# Patient Record
Sex: Male | Born: 1985 | Race: White | Hispanic: No | Marital: Single | Smoking: Former smoker
Health system: Southern US, Community
[De-identification: ages and names within clinical notes are randomized; demographics above are authoritative.]

## PROBLEM LIST (undated history)

## (undated) DIAGNOSIS — I471 Supraventricular tachycardia, unspecified: Secondary | ICD-10-CM

## (undated) HISTORY — DX: Supraventricular tachycardia, unspecified: I47.10

## (undated) HISTORY — DX: Supraventricular tachycardia: I47.1

---
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
## 2006-09-05 ENCOUNTER — Ambulatory Visit: Payer: Self-pay | Admitting: Unknown Physician Specialty

## 2006-09-28 ENCOUNTER — Ambulatory Visit: Payer: Self-pay | Admitting: Unknown Physician Specialty

## 2009-10-24 ENCOUNTER — Emergency Department: Payer: Self-pay | Admitting: Emergency Medicine

## 2010-02-11 ENCOUNTER — Emergency Department: Payer: Self-pay | Admitting: Emergency Medicine

## 2010-02-11 ENCOUNTER — Encounter: Payer: Self-pay | Admitting: Cardiovascular Disease

## 2010-02-15 ENCOUNTER — Ambulatory Visit: Payer: Self-pay | Admitting: Cardiovascular Disease

## 2010-02-15 DIAGNOSIS — R002 Palpitations: Secondary | ICD-10-CM

## 2010-02-22 ENCOUNTER — Encounter: Payer: Self-pay | Admitting: Cardiovascular Disease

## 2010-02-24 ENCOUNTER — Encounter: Payer: Self-pay | Admitting: Cardiovascular Disease

## 2010-02-24 ENCOUNTER — Ambulatory Visit: Payer: Self-pay | Admitting: Cardiology

## 2010-02-24 ENCOUNTER — Ambulatory Visit: Payer: Self-pay | Admitting: Cardiovascular Disease

## 2010-04-10 ENCOUNTER — Ambulatory Visit: Payer: Self-pay | Admitting: Cardiovascular Disease

## 2010-04-10 DIAGNOSIS — R55 Syncope and collapse: Secondary | ICD-10-CM

## 2010-04-14 ENCOUNTER — Encounter: Payer: Self-pay | Admitting: Cardiovascular Disease

## 2010-04-28 ENCOUNTER — Encounter: Payer: Self-pay | Admitting: Cardiovascular Disease

## 2010-05-02 ENCOUNTER — Telehealth: Payer: Self-pay | Admitting: Cardiovascular Disease

## 2010-05-22 ENCOUNTER — Encounter: Payer: Self-pay | Admitting: Cardiovascular Disease

## 2010-06-12 ENCOUNTER — Ambulatory Visit: Payer: Self-pay | Admitting: Cardiovascular Disease

## 2010-06-12 DIAGNOSIS — R0989 Other specified symptoms and signs involving the circulatory and respiratory systems: Secondary | ICD-10-CM | POA: Insufficient documentation

## 2010-11-28 NOTE — Assessment & Plan Note (Signed)
Summary: NP6/AMD   Visit Type:  [facility] Referring Provider:  [facility] Primary Provider:  n/a  CC:  palpitations, sweating, chills, feel like going to pass out, and feel like heart goes crazy - to beating out of chest. Shortness of breath with symptoms. no edema. .  History of Present Illness: 25 year-old male presents for initial evaluation of palpitations. He was evaluated in the Emergency Room at [facility] after the episode, but symptoms have resolved by the time he was evaluated. 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 Felt fatigued for a short time, but has been back to normal state of health since then. He admits to similar episodes about 10 times in the past, but has never sought medical evaluation.   Denies exertional chest pain or dyspnea, but has had episodes of heart racing with activity such as playing basketball when he had to stop playing and sit down because of dizziness, near-syncope.  Preventive Screening-Counseling & Management  Alcohol-Tobacco     Alcohol drinks/day: <1     Smoking Status: current     Packs/Day: 0.5  Caffeine-Diet-Exercise     Caffeine use/day: 80 oz     Does Patient Exercise: no      Drug Use:  yes.    Current Medications (verified): 1)  None  Allergies (verified): No Known Drug Allergies  Past History:  Past medical, surgical, family and social histories (including risk factors) reviewed, and no changes noted (except as noted below).  Past Medical History: none  Past Surgical History: none  Family History: Reviewed history and no changes required. Maternal grandfather: AAA Mom: living 63; severe emphysema, and COPD Dad: living 22; healthy  Social History: Reviewed history and no changes required. Full Time Single  Tobacco Use - Yes.  Alcohol Use - yes Regular Exercise -  no Drug Use - yes Drinks 1 soda and 4-5 sweet teas every day Marijuana - daily Alcohol drinks/day:  <1 Smoking Status:  current Packs/Day:  0.5 Caffeine use/day:  80 oz Does Patient Exercise:  no Drug Use:  yes  Review of Systems       Negative except as per HPI   Vital Signs:  Patient profile:   25 year old male Height:      70 inches Weight:      129.50 pounds BMI:     18.65 Pulse rate:   80 / minute Pulse rhythm:   regular Resp:     12 per minute BP sitting:   110 / 78  (left arm) Cuff size:   regular  Vitals Entered By: Mercer Pod (February 15, 2010 4:13 PM)  Physical Exam  General:  Pt is alert and oriented, thin male, in no acute distress. HEENT: normal Neck: normal carotid upstrokes without bruits, JVP normal Lungs: CTA CV: RRR without murmur or gallop Abd: soft, NT, positive BS, no bruit, no organomegaly Ext: no clubbing, cyanosis, or edema. peripheral pulses 2+ and equal Skin: warm and dry without rash    EKG  Procedure date:  02/11/2010  Findings:      Normal sinus rhythm with sinus arrhythmia, within normal limits. Heart rate 89 beats per minute.  Impression & Recommendations:  Problem # 1:  PALPITATIONS, OCCASIONAL (ICD-785.1) The patient has sudden onset of palpitations and heart racing with multiple associated symptoms as listed. His symptoms are concerning for supraventricular  tachycardia. Because he has also had symptoms related to exertion, recommend 2-D echocardiogram to rule out structural heart disease and treadmill exercise testing to evaluate his heart rate and hemodynamic response to exertion.  Lung discussion regarding lifestyle modification as well. We discussed the importance of complete tobacco and marijuana cessation. He also needs to reduce caffeine intake. He has a family history of both emphysema and abdominal aortic aneurysm and we discussed the relationship of tobacco use to these diseases.  Orders: Echocardiogram  (Echo) Treadmill (Treadmill)  Patient Instructions: 1)  Your physician recommends that you schedule a follow-up appointment in: June in [place] with Dr Excell Seltzer. 2)  Your physician has requested that you have an echocardiogram.  Echocardiography is a painless test that uses sound waves to create images of your heart. It provides your doctor with information about the size and shape of your heart and how well your heart's chambers and valves are working.  This procedure takes approximately one hour. There are no restrictions for this procedure. 3)  Your physician has requested that you have an exercise tolerance test.  For further information please visit [email or web address]

## 2010-11-28 NOTE — Miscellaneous (Signed)
  Clinical Lists Changes  Medications: Added new medication of METOPROLOL TARTRATE 25 MG TABS (METOPROLOL TARTRATE) Take one tablet by mouth twice a day as needed for fast heartrate - Signed Rx of METOPROLOL TARTRATE 25 MG TABS (METOPROLOL TARTRATE) Take one tablet by mouth twice a day as needed for fast heartrate;  #60 x 1;  Signed;  Entered by: Benedict Needy, RN;  Authorized by: Dossie Arbour MD;  Method used: Electronically to [facility] [address] [phone].*, [address] [place], [place], [place]  [address], Ph: [phone], Fax: [phone]    Prescriptions: METOPROLOL TARTRATE 25 MG TABS (METOPROLOL TARTRATE) Take one tablet by mouth twice a day as needed for fast heartrate  #60 x 1   Entered by:   Benedict Needy, RN   Authorized by:   Dossie Arbour MD   Signed by:   Benedict Needy, RN on 04/28/2010   Method used:   Electronically to        [facility] [address] [phone].* (retail)       [address]       [place], [place]  [address]       Ph: [phone]       Fax: [phone]   RxID:   [id number]

## 2010-11-28 NOTE — Progress Notes (Signed)
Summary: Medication  Phone Note Call from Patient Call back at Home Phone [phone]   Caller: Patient Call For: RN Summary of Call: Patient called and stated he was started on a new medication and he has a cold.  Wants to know what medications he can talk for his cold. Initial call taken by: West Carbo,  May 02, 2010 4:08 PM  Follow-up for Phone Call        pt called told him to stay away from robitussin DM Follow-up by: Benedict Needy, RN,  May 02, 2010 4:25 PM

## 2010-11-28 NOTE — Procedures (Signed)
Summary: Holter and Event  Holter and Event   Imported By: West Carbo 05/22/2010 08:06:58  _____________________________________________________________________  External Attachment:    Type:   Image     Comment:   External Document  Appended Document: Holter and Event attempted to call pt with results of event monitor LMOM TCB.  Appended Document: Holter and Event LMOM TCB  Appended Document: Holter and Event pt aware of results.

## 2010-11-28 NOTE — Assessment & Plan Note (Signed)
Summary: F6W/AMD   Visit Type:  Follow-up Referring Provider:  [facility] Primary Provider:  n/a  CC:  rapid heart beats and feeling fatigue.  History of Present Illness: 25 year-old male presenting for follow-up of palpitations. The patient was seen here and conservative therapy was recommended. He underwent an exercise treadmill study demonstrating excellent exercise tolerance and he had no ischemic symptoms, EKG changes, or arrhythmia.   However, he continues to be highly symptomatic with recurrent palpitations associated with fatigue, weakness, and near-syncope. He has had 2 episodes of this in the past week, one lasting about 30 minutes and another about 5 minutes. He denies chest pain, dyspnea, or other complaints.  Current Medications (verified): 1)  None  Allergies (verified): No Known Drug Allergies  Past History:  Past medical history reviewed for relevance to current acute and chronic problems.  Past Medical History: Reviewed history from 02/15/2010 and no changes required. none  Review of Systems       Negative except as per HPI   Vital Signs:  Patient profile:   25 year old male Height:      70 inches Weight:      129 pounds BMI:     18.58 Pulse rate:   76 / minute Resp:     16 per minute BP sitting:   118 / 76  (left arm) Cuff size:   regular  Vitals Entered By: Bishop Dublin, CMA (April 10, 2010 2:31 PM)  Physical Exam  General:  Pt is alert and oriented, thin young male, in no acute distress. HEENT: normal Neck: normal carotid upstrokes without bruits, JVP normal Lungs: CTA CV: RRR without murmur or gallop Abd: soft, NT, positive BS, no bruit, no organomegaly Ext: no clubbing, cyanosis, or edema. peripheral pulses 2+ and equal Skin: warm and dry without rash    EKG  Procedure date:  04/10/2010  Findings:      NSR, minimal voltage criteria for LVH, otherwise WNL  Impression & Recommendations:  Problem # 1:  SYNCOPE (ICD-780.2) This pt  has recurrent palpitations with associated near-syncope, of abrupt onset. I have recommended an event record to further evaluate and attempt to capture an episode so that treatment can be directed at a specific arrhythmia. Will schedule him to f/u with Dr Mariah Milling in 2 months. No meds at this point.  Other Orders: EKG w/ Interpretation (93000) Event (Event)  Patient Instructions: 1)  Your physician recommends that you schedule a follow-up appointment in: 2 months with Gollan 2)  Your physician recommends that you continue on your current medications as directed. Please refer to the Current Medication list given to you today.

## 2010-11-28 NOTE — Assessment & Plan Note (Signed)
Summary: F2M/AMD   Visit Type:  Follow-up Referring Provider:  [facility] Primary Provider:  n/a  CC:  chest pain/at work and 06-11-10.  History of Present Illness: 25 year-old Luis [name] presenting for follow-up of tachycardia associated with near syncope, diaphoresis. He presents for routine followup.  We had him complete an event monitor for 30 days and he reports that during that time, he did not have an episode of his tachycardia arrhythmia. He reports having an episode yesterday lasting for more than 30 minutes. 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 He felt dizzy, diaphoretic. His heart rate was "racing".  Two episodes of sinus tachycardia were recorded on his event monitor but he denies that he was symptomatic during these episodes. During one episode, he reports he may have been playing basketball.  he reports having 6 episodes this year, probably 20 episodes over the past several years.  EKG shows normal sinus rhythm with a rate of Luis beats per minute, no significant ST or T wave changes.  Current Medications (verified): 1)  Metoprolol Tartrate 25 Mg Tabs (Metoprolol Tartrate) .... Take One Tablet By Mouth Twice A Day As Needed For Fast Heartrate  Allergies (verified): No Known Drug Allergies   Review of Systems  The patient denies fever, weight loss, weight gain, vision loss, decreased hearing, hoarseness, chest pain, syncope, dyspnea on exertion, peripheral edema, prolonged cough, abdominal pain, incontinence, muscle weakness, depression, and enlarged lymph nodes.         Tachypalpitations yesterday  Vital Signs:  Patient profile:   25 year old Luis [name] Height:      70 inches Weight:      130.75 pounds BMI:     18.83 Pulse rate:   98 / minute BP sitting:   114 / 62  (left arm) Cuff size:   regular  Vitals Entered By: Caralee Ates CMA (June 12, 2010 2:18 PM)  Physical Exam  General:  Pt is alert and oriented, thin young Luis [name], in no  acute distress. HEENT: normal Neck: normal carotid upstrokes without bruits, JVP normal Lungs: CTA CV: RRR without murmur or gallop Abd: soft, NT, positive BS, no bruit, no organomegaly Ext: no clubbing, cyanosis, or edema. peripheral pulses 2+ and equal Skin: warm and dry without rash    New Orders:     1)  EKG w/ Interpretation (93000)   Impression & Recommendations:  Problem # 1:  TACHYCARDIA (ICD-785) I suspect that he is having SVT or some other tachyarrhythmia  that we have been unable to document this on an event monitor. The case was discussed with Dr. Graciela Husbands of the electrophysiology Department. We will recommend that he try to document his rhythm was either with rapid presentation to a fire Department for telemetry monitoring or to our office for EKG. I have asked him to try verapamil 40 mg p.r.n. with his metoprolol 25 mg p.r.n. if he has episodes. We have also talked him able maneuvers as well as carotid sinus massage.  As soon as we are able to identify his underlying paroxysmal and frequent tachyarrhythmia, we will be able to proceed either with ablation or medical management.  Other Orders: EKG w/ Interpretation (93000)  Patient Instructions: 1)  Your physician has recommended you make the following change in your medication: START verapamil once daily as needed for fast heart rate.  2)  Go to fire department when you have and episode for EKG. 3)  Your physician wants  you to follow-up in:  3 months  You will receive a reminder letter in the mail two months in advance. If you don't receive a letter, please call our office to schedule the follow-up appointment. Prescriptions: VERAPAMIL HCL 40 MG TABS (VERAPAMIL HCL) Take 1 tablet by mouth once a day as needed for fast heart rate  #30 x 1   Entered by:   Benedict Needy, RN   Authorized by:   Dossie Arbour MD   Signed by:   Benedict Needy, RN on 06/12/2010   Method used:   Electronically to        [facility] [address]  [phone].* (retail)       [address]       [place], [place]  [address]       Ph: [phone]       Fax: [phone]   RxID:   [id number]

## 2010-11-28 NOTE — Progress Notes (Signed)
Summary: PHI  PHI   Imported By: Park Breed 02/22/2010 15:50:55  _____________________________________________________________________  External Attachment:    Type:   Image     Comment:   External Document

## 2010-11-30 NOTE — Procedures (Signed)
Summary: [facility]  [facility]   Imported By: Harlon Flor 11/02/2010 10:11:07  _____________________________________________________________________  External Attachment:    Type:   Image     Comment:   External Document

## 2011-01-06 ENCOUNTER — Encounter: Payer: Self-pay | Admitting: Cardiovascular Disease

## 2011-01-06 ENCOUNTER — Emergency Department: Payer: Self-pay | Admitting: Emergency Medicine

## 2011-01-18 ENCOUNTER — Telehealth: Payer: Self-pay | Admitting: *Deleted

## 2011-01-18 NOTE — Telephone Encounter (Signed)
Called and LMOM TCB. Pt had dropped off EKG showing SVT. Pt was told by Dr. Mariah Milling if he had another episode to bring by our office. When able to contact pt, need to schedule him with EP.

## 2011-01-19 ENCOUNTER — Ambulatory Visit (INDEPENDENT_AMBULATORY_CARE_PROVIDER_SITE_OTHER): Payer: Self-pay | Admitting: Cardiovascular Disease

## 2011-01-19 ENCOUNTER — Encounter (INDEPENDENT_AMBULATORY_CARE_PROVIDER_SITE_OTHER): Payer: Self-pay | Admitting: Cardiovascular Disease

## 2011-01-19 ENCOUNTER — Encounter: Payer: Self-pay | Admitting: Cardiovascular Disease

## 2011-01-19 VITALS — BP 100/58 | HR 56 | Resp 16 | Ht 70.0 in | Wt 126.0 lb

## 2011-01-19 VITALS — BP 100/58 | HR 56 | Ht 70.0 in | Wt 126.0 lb

## 2011-01-19 DIAGNOSIS — R0989 Other specified symptoms and signs involving the circulatory and respiratory systems: Secondary | ICD-10-CM

## 2011-01-19 DIAGNOSIS — I471 Supraventricular tachycardia, unspecified: Secondary | ICD-10-CM

## 2011-01-19 DIAGNOSIS — R55 Syncope and collapse: Secondary | ICD-10-CM

## 2011-01-19 DIAGNOSIS — I498 Other specified cardiac arrhythmias: Secondary | ICD-10-CM

## 2011-01-19 NOTE — Progress Notes (Signed)
   Patient ID: Luis [name], male    DOB: November 24, 1985, 24 y.o.   MRN: [id number]  HPI Comments: 25 year-old male presenting for follow-up of tachycardia associated with near syncope, diaphoresis.   He has had a three-year history of episodes of tachycardia palpitation. His last episode was approximately 10 days ago. EMTs were called, did an EKG him a noticed a narrow complex tachycardia and gave him adenosine which converted his rhythm to normal sinus.  He has had more than 20 episodes over the past several years, the last episode prior to his most recent event was 6 months ago. He has been unable to tolerate rhythm controlling medication as he has baseline bradycardia. He is very interested in a ablation.  In the past, he did complete an event monitor for 30 days and he reports that during that time, he did not have an episode of his tachycardia arrhythmia.   he reports having 6 episodes in 2011.  EKG shows normal sinus rhythm with a rate of 56 beats per minute, no significant ST or T wave changes.Nonspecific ST changes noted.     Review of Systems  Constitutional: Negative.   HENT: Negative.   Eyes: Negative.   Respiratory: Negative.   Cardiovascular: Negative for chest pain and leg swelling.       Tachycardia/palpitations  Gastrointestinal: Negative.   Musculoskeletal: Negative.   Skin: Negative.   Neurological: Negative.   Hematological: Negative.   Psychiatric/Behavioral: Negative.   All other systems reviewed and are negative.    BP 100/58  Pulse 56  Wt 126 lb (57.153 kg)   Physical Exam  Nursing note and vitals reviewed. Constitutional: He is oriented to person, place, and time. He appears well-developed and well-nourished.  HENT:  Head: Normocephalic.  Nose: Nose normal.  Mouth/Throat: Oropharynx is clear and moist.  Eyes: Conjunctivae are normal. Pupils are equal, round, and reactive to light.  Neck: Normal range of motion. Neck supple. No JVD present.    Cardiovascular: Normal rate, regular rhythm, normal heart sounds and intact distal pulses.  Exam reveals no gallop and no friction rub.   No murmur heard. Pulmonary/Chest: Effort normal and breath sounds normal. No respiratory distress. He has no wheezes. He has no rales. He exhibits no tenderness.  Abdominal: Soft. Bowel sounds are normal. He exhibits no distension. There is no tenderness.  Musculoskeletal: Normal range of motion. He exhibits no edema and no tenderness.  Lymphadenopathy:    He has no cervical adenopathy.  Neurological: He is alert and oriented to person, place, and time. Coordination normal.  Skin: Skin is warm and dry. No rash noted. No erythema.  Psychiatric: He has a normal mood and affect. His behavior is normal. Judgment and thought content normal.           Assessment and Plan

## 2011-01-19 NOTE — Patient Instructions (Signed)
Dr. Sharrell Ku will contact you in the next few days to arrange an appt. In [place] to meet with him. At this Appt., he will discuss further treatment options for your tachycardia.

## 2011-01-19 NOTE — Assessment & Plan Note (Signed)
Recent EKG done by EMTs confirms SVT. Last episode 10 days ago that converted with adenosine IV. He is interested in an ablation. We will set him up for followup in [place] with EP to discuss the various treatment options. He reports more than 20 episodes over the past several years.

## 2011-01-22 ENCOUNTER — Encounter: Payer: Self-pay | Admitting: Cardiovascular Disease

## 2011-01-22 NOTE — Assessment & Plan Note (Signed)
Previous epsiodes of syncope and near syncope likely secondary to previous epsiodes of SVT.  Will refer for ablation.

## 2011-01-24 ENCOUNTER — Ambulatory Visit: Payer: Self-pay | Admitting: Internal Medicine

## 2011-01-25 ENCOUNTER — Encounter: Payer: Self-pay | Admitting: Internal Medicine

## 2011-01-26 ENCOUNTER — Ambulatory Visit (INDEPENDENT_AMBULATORY_CARE_PROVIDER_SITE_OTHER): Payer: Self-pay | Admitting: Internal Medicine

## 2011-01-26 ENCOUNTER — Encounter: Payer: Self-pay | Admitting: Internal Medicine

## 2011-01-26 VITALS — BP 120/70 | HR 65 | Ht 70.0 in | Wt 126.0 lb

## 2011-01-26 DIAGNOSIS — I498 Other specified cardiac arrhythmias: Secondary | ICD-10-CM

## 2011-01-26 DIAGNOSIS — I471 Supraventricular tachycardia, unspecified: Secondary | ICD-10-CM

## 2011-01-26 NOTE — Progress Notes (Signed)
Mr Luis [name] is a pleasant 25 yo WM with a history of recently diagnosed SVT who presents today for EP consultation.  He reports initially developing symptoms of "heart racing" two years ago.  He states that two years ago, he sneezed and then developed abrupt tachypalpitations lasting several seconds.  He subsequently noted intermittent episodes of abrupt onset and termination of "heart racing" frequently lasting several minutes.  Unfortunately, episodes were unpredictable and could not be captured.  He states that he has made trips to the ER but had converted to sinus rhythm upon being evaluated.  He also was evaluated in our office and had a holter monitor which also failed to document tachycardia.  He is unaware of clear triggers for tachycardia but has found that bending over or lifted would cause tachycardia.  He has not found vagal maneuvers particularly helpful. On 01/06/11, he developed sustained tachycardia while carrying a box of produce at work.  He was evaluated by EMS and found to have SVT at 210 bpm.  His tachycardia terminated with adenosine.  He has not had further tachycardia since that time.  Today, he denies symptoms of palpitations, chest pain, shortness of breath, orthopnea, PND, lower extremity edema, dizziness, presyncope, syncope, or neurologic sequela. The patient is otherwise without complaint today.  He has metoprolol to take as needed during SVT.   Past Medical History  Diagnosis Date  . SVT (supraventricular tachycardia)    Past Surgical History  Procedure Date  . Root canal 2005    Current outpatient prescriptions:metoprolol tartrate (LOPRESSOR) 25 MG tablet, Take 25 mg by mouth 2 (two) times daily as needed.  , Disp: , Rfl:   No Known Allergies  History   Social History  . Marital Status: Single    Spouse Name: N/A    Number of Children: N/A  . Years of Education: N/A   Occupational History  . Not on file.   Social History Main Topics  . Smoking status:  Current Everyday Smoker -- 1.0 packs/day for 6 years    Types: Cigarettes  . Smokeless tobacco: Not on file  . Alcohol Use: 0.6 oz/week    1 Cans of beer per week     drinks on weekends  . Drug Use: 2 per week    Special: Marijuana  . Sexually Active: Not on file   Other Topics Concern  . Not on file   Social History Narrative   Lives in [place] with girlfriends father.  Works as Actor.    Family History  Problem Relation Age of Onset  . Hypertension Father   . Heart disease Maternal Grandfather     ROS- All systems are reviewed and negative except as per the HPI above  Physical Exam: Filed Vitals:   01/26/11 1229  BP: 120/70  Pulse: 65  Height: 5\' 10"  (1.778 m)  Weight: 126 lb (57.153 kg)    GEN- The patient is well appearing, alert and oriented x 3 today.   Head- normocephalic, atraumatic Eyes-  Sclera clear, conjunctiva pink Ears- hearing intact Oropharynx- clear Neck- supple, no JVP Lymph- no cervical lymphadenopathy Lungs- Clear to ausculation bilaterally, normal work of breathing Heart- Regular rate and rhythm, no murmurs, rubs or gallops, PMI not laterally displaced GI- soft, NT, ND, + BS Extremities- no clubbing, cyanosis, or edema MS- no significant deformity or atrophy Skin- no rash or lesion Psych- euthymic mood, full affect Neuro- strength and sensation are intact

## 2011-01-26 NOTE — Assessment & Plan Note (Signed)
Luis [name] presents today for further evaluation of SVT.  He has well documented narrow complex SVT at 210 bpm from 01/06/11. This tachycardia was terminated with adenosine.  I have reviewed an ekg from today which reveals sinus rhythm at 65 bpm, with PR 148, QRS 102, and Qtc 409.  His ekg today is normal and there is no evidence of pre-excitation. Therapeutic strategies for supraventricular tachycardia including medicine and ablation were discussed in detail with the patient today. Risk, benefits, and alternatives to EP study and radiofrequency ablation were also discussed in detail today. At the present time, he wishes to further contemplate his options.  He will contact my office if he wishes to proceed with ablation. I have scheduled follow-up in 4 months.

## 2011-01-26 NOTE — Patient Instructions (Signed)
Your physician recommends that you schedule a follow-up appointment in: 4 months with Dr Allred   Your physician recommends that you continue on your current medications as directed. Please refer to the Current Medication list given to you today.   

## 2011-01-29 NOTE — Progress Notes (Signed)
Addended by: Laurance Flatten on: 01/29/2011 11:04 AM   Modules accepted: Orders

## 2011-04-15 IMAGING — CR DG CHEST 2V
1 series · 2 of 2 positions shown · non-contrast
Comparison: none

REASON FOR EXAM: palpatations
COMMENTS:

PROCEDURE:     DXR - DXR CHEST PA (OR AP) AND LATERAL  - February 11, 2010  [DATE]
RESULT:     The lungs are clear. The heart and pulmonary vessels are normal.
The bony and mediastinal structures are unremarkable. There is no effusion.
There is no pneumothorax or evidence of congestive failure.

[Series 1: view not recorded · 0.17mm/px · 2 of 2 slices shown]
[im 1/2]
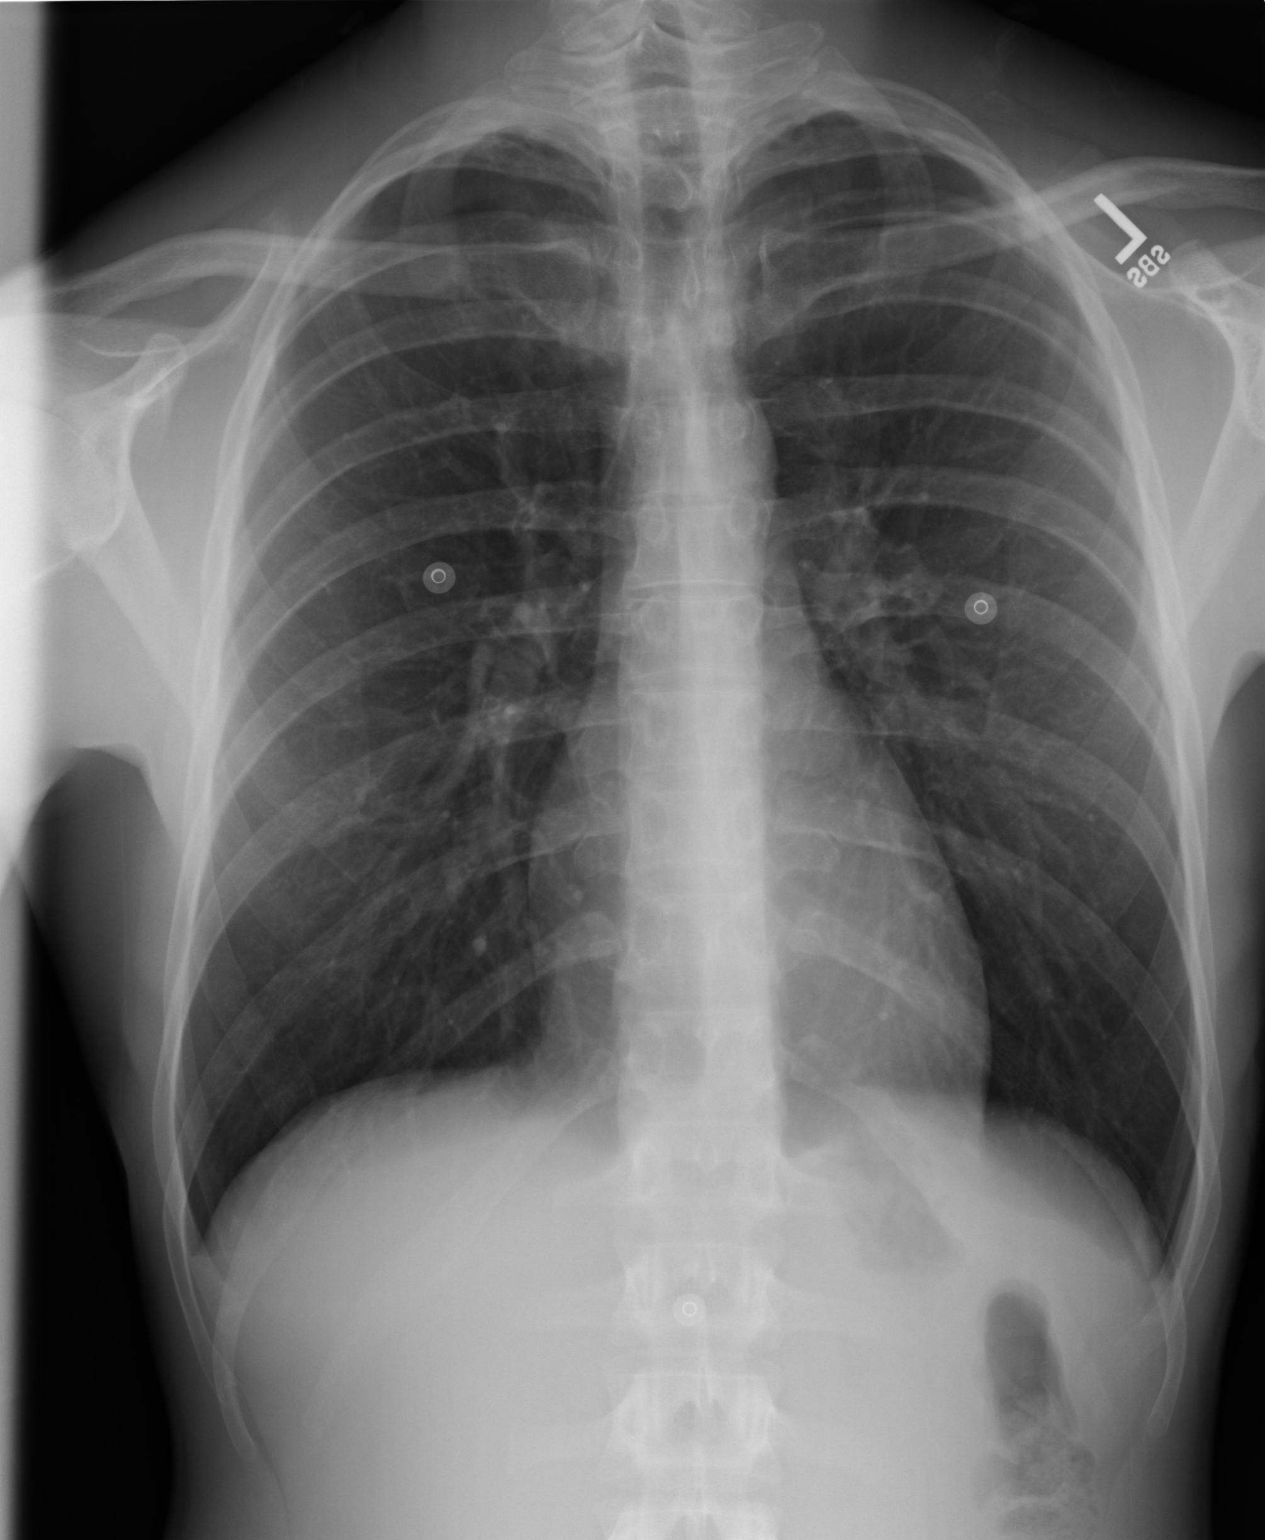
[im 2/2]
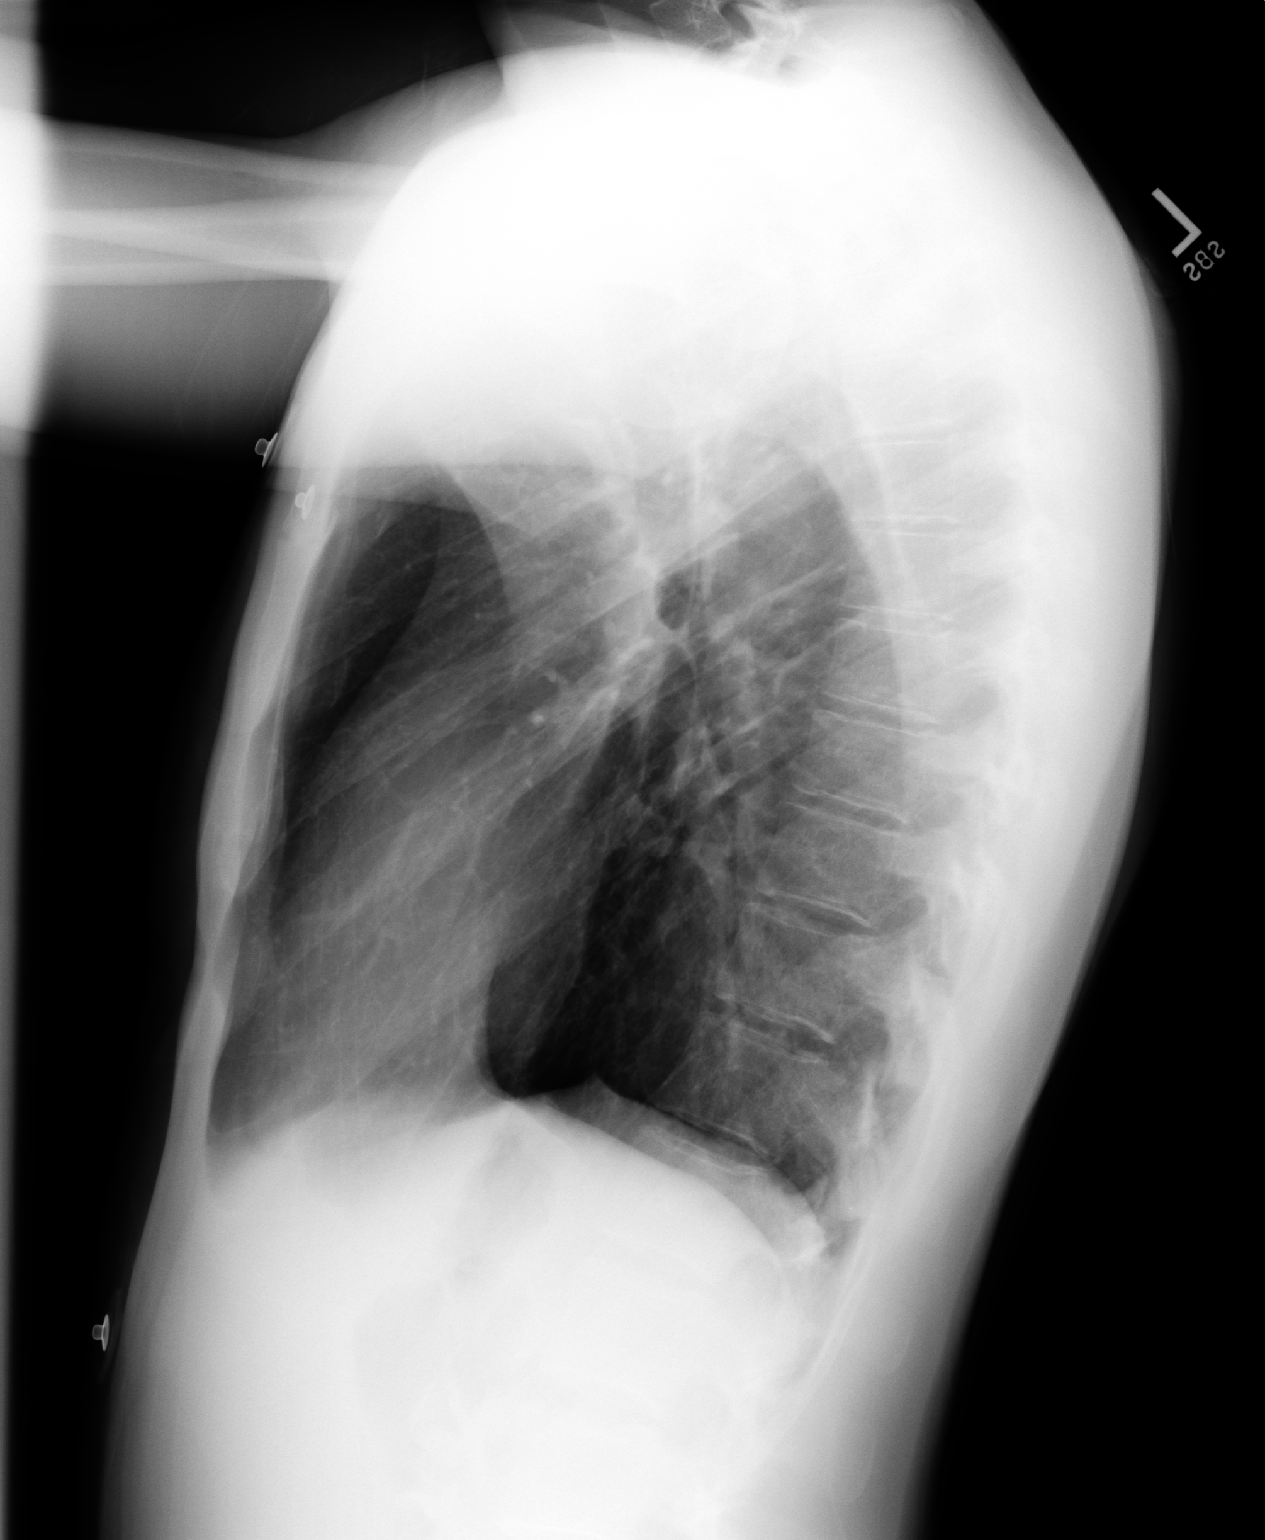

[2 of 2 positions shown; findings below may reference images not displayed]

IMPRESSION: No acute cardiopulmonary disease. There is mild
hyperinflation of the lungs which is nonspecific.

## 2011-06-18 NOTE — Progress Notes (Signed)
Addended by: Hillis Range on: 06/18/2011 02:19 PM   Modules accepted: Orders

## 2011-08-26 ENCOUNTER — Emergency Department: Payer: Self-pay | Admitting: Emergency Medicine

## 2011-09-11 ENCOUNTER — Emergency Department: Payer: Self-pay | Admitting: Internal Medicine

## 2012-03-09 IMAGING — CR DG CHEST 1V PORT
1 series · 1 of 1 positions shown · non-contrast
Comparison: none

REASON FOR EXAM: SVT, NOW CONVERTED
COMMENTS:

[view not recorded]
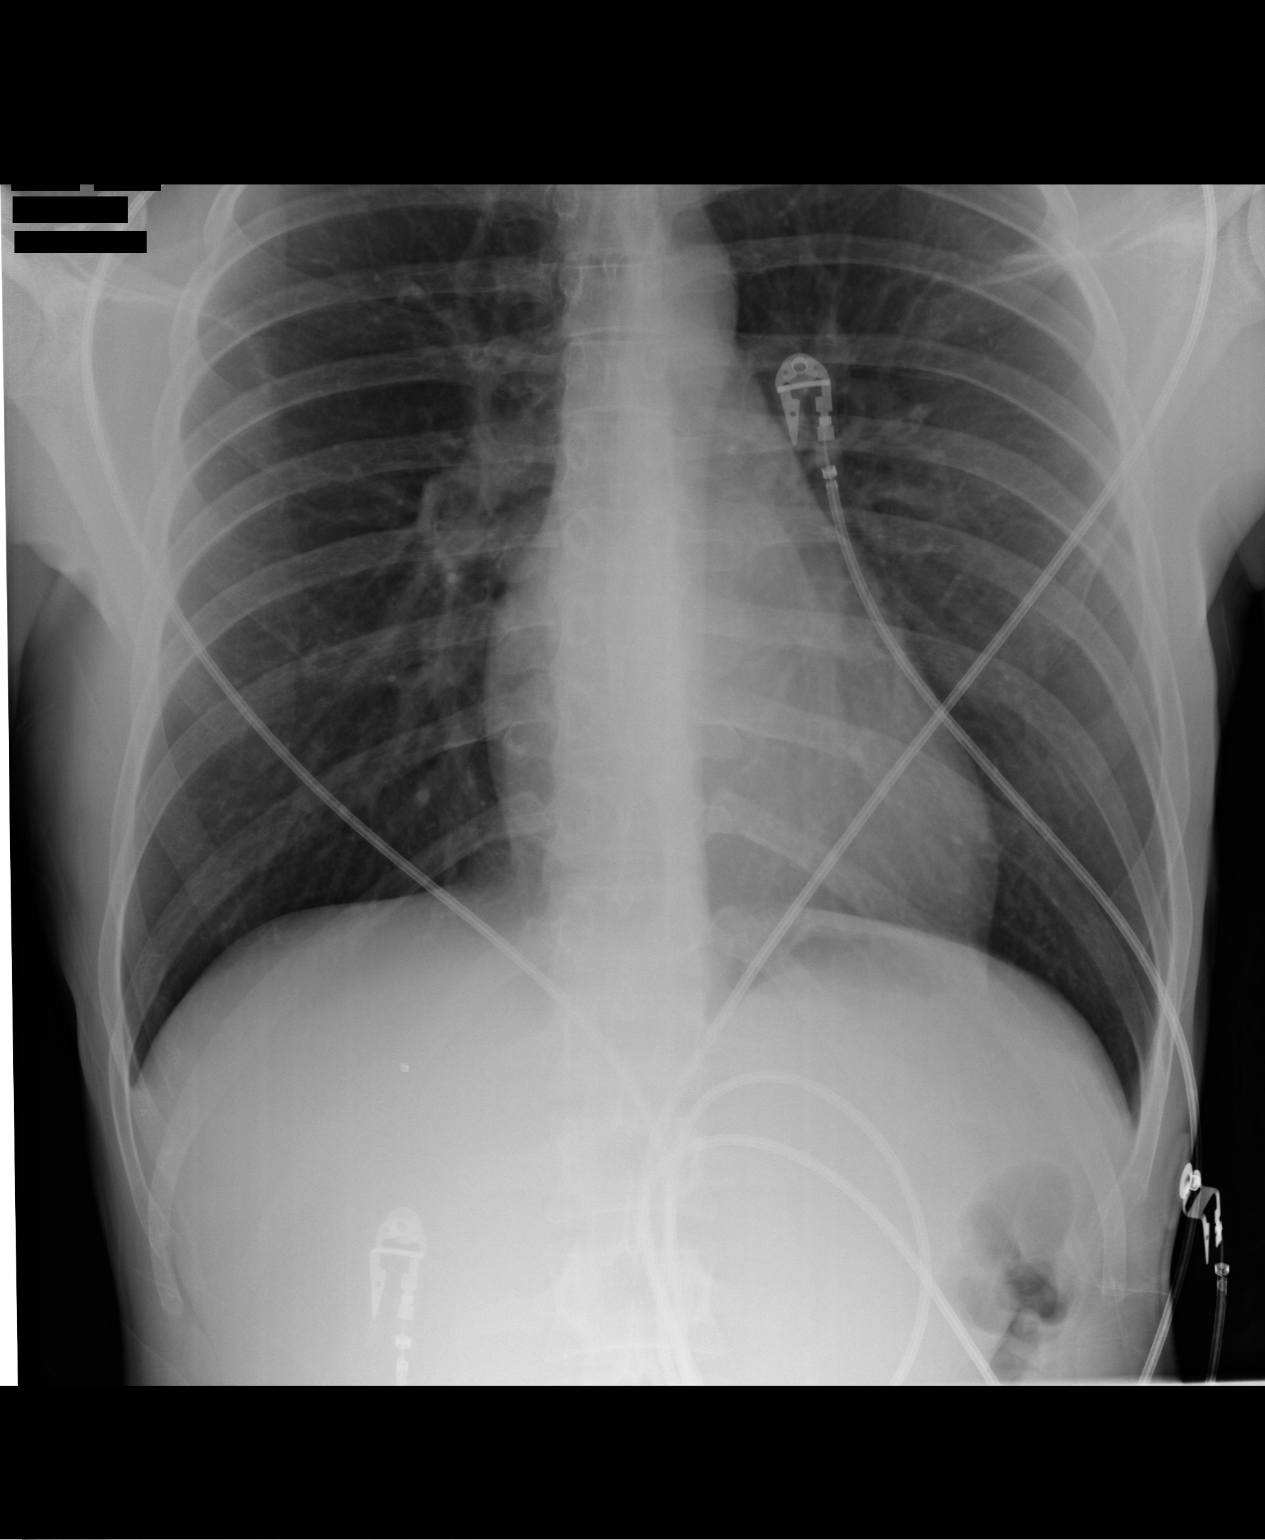

[1 of 1 positions shown; findings below may reference images not displayed]

PROCEDURE:     DXR - DXR PORTABLE CHEST SINGLE VIEW  - January 06, 2011 [DATE]

RESULT:     Comparison is made to study 11 February, 2010.

The lungs are well-expanded. There is no focal infiltrate. The cardiac
silhouette is normal in size. The pulmonary vascularity is not engorged.
There is no pleural effusion.
IMPRESSION: 1. I do not see evidence of CHF.
2. There is mild hyperinflation of the lungs which may reflect underlying
reactive airway disease. This is not a new finding.

## 2012-12-30 ENCOUNTER — Emergency Department: Payer: Self-pay | Admitting: Emergency Medicine

## 2013-02-07 ENCOUNTER — Emergency Department: Payer: Self-pay | Admitting: Internal Medicine

## 2014-05-31 ENCOUNTER — Emergency Department: Payer: Self-pay | Admitting: Emergency Medicine

## 2014-07-16 ENCOUNTER — Emergency Department: Payer: Self-pay | Admitting: Emergency Medicine

## 2015-09-07 NOTE — Progress Notes (Signed)
This encounter was created in error - please disregard.

## 2016-11-26 ENCOUNTER — Encounter: Payer: Self-pay | Admitting: Emergency Medicine

## 2016-11-26 ENCOUNTER — Emergency Department: Payer: Commercial Managed Care - HMO

## 2016-11-26 ENCOUNTER — Emergency Department
Admission: EM | Admit: 2016-11-26 | Discharge: 2016-11-27 | Disposition: A | Discharge status: Home / Self Care | Payer: Commercial Managed Care - HMO | Attending: Emergency Medicine | Admitting: Emergency Medicine

## 2016-11-26 DIAGNOSIS — L02211 Cutaneous abscess of abdominal wall: Secondary | ICD-10-CM | POA: Insufficient documentation

## 2016-11-26 DIAGNOSIS — F17210 Nicotine dependence, cigarettes, uncomplicated: Secondary | ICD-10-CM | POA: Diagnosis not present

## 2016-11-26 LAB — COMPREHENSIVE METABOLIC PANEL
ALBUMIN: 4.3 g/dL (ref 3.5–5.0)
ALK PHOS: 50 U/L (ref 38–126)
ALT: 12 U/L — ABNORMAL LOW (ref 17–63)
AST: 16 U/L (ref 15–41)
Anion gap: 5 (ref 5–15)
BILIRUBIN TOTAL: 0.6 mg/dL (ref 0.3–1.2)
BUN: 14 mg/dL (ref 6–20)
CALCIUM: 8.8 mg/dL — AB (ref 8.9–10.3)
CO2: 30 mmol/L (ref 22–32)
CREATININE: 1.17 mg/dL (ref 0.61–1.24)
Chloride: 102 mmol/L (ref 101–111)
GFR calc Af Amer: >60 mL/min (ref >60)
GFR calc non Af Amer: >60 mL/min (ref >60)
GLUCOSE: 91 mg/dL (ref 65–99)
POTASSIUM: 3.3 mmol/L — AB (ref 3.5–5.1)
Sodium: 137 mmol/L (ref 135–145)
TOTAL PROTEIN: 7.2 g/dL (ref 6.5–8.1)

## 2016-11-26 LAB — CBC WITH DIFFERENTIAL/PLATELET
Basophils Absolute: 0.1 10*3/uL (ref 0–0.1)
Basophils Relative: 1 %
Eosinophils Absolute: 0.3 10*3/uL (ref 0–0.7)
Eosinophils Relative: 2 %
HEMATOCRIT: 41.5 % (ref 40.0–52.0)
Hemoglobin: 14.5 g/dL (ref 13.0–18.0)
LYMPHS PCT: 18 %
Lymphs Abs: 2.5 10*3/uL (ref 1.0–3.6)
MCH: 30.4 pg (ref 26.0–34.0)
MCHC: 34.9 g/dL (ref 32.0–36.0)
MCV: 87.2 fL (ref 80.0–100.0)
MONO ABS: 1.2 10*3/uL — AB (ref 0.2–1.0)
MONOS PCT: 8 %
NEUTROS ABS: 10.3 10*3/uL — AB (ref 1.4–6.5)
Neutrophils Relative %: 71 %
Platelets: 222 10*3/uL (ref 150–440)
RBC: 4.76 MIL/uL (ref 4.40–5.90)
RDW: 13.7 % (ref 11.5–14.5)
WBC: 14.3 10*3/uL — ABNORMAL HIGH (ref 3.8–10.6)

## 2016-11-26 MED ORDER — IOPAMIDOL (ISOVUE-300) INJECTION 61%
30.0000 mL | Freq: Once | INTRAVENOUS | Status: AC | PRN
  Administered 2016-11-26: 30 mL via ORAL
  Filled 2016-11-26: qty 30

## 2016-11-26 MED ORDER — SODIUM CHLORIDE 0.9 % IV BOLUS (SEPSIS)
1000.0000 mL | Freq: Once | INTRAVENOUS | Status: AC
  Administered 2016-11-26: 1000 mL via INTRAVENOUS

## 2016-11-26 MED ORDER — IOPAMIDOL (ISOVUE-300) INJECTION 61%
100.0000 mL | Freq: Once | INTRAVENOUS | Status: AC | PRN
  Administered 2016-11-26: 100 mL via INTRAVENOUS
  Filled 2016-11-26: qty 100

## 2016-11-26 MED ORDER — CLINDAMYCIN PHOSPHATE 600 MG/50ML IV SOLN
600.0000 mg | Freq: Once | INTRAVENOUS | Status: AC
  Administered 2016-11-26: 600 mg via INTRAVENOUS
  Filled 2016-11-26: qty 50

## 2016-11-26 MED ORDER — LIDOCAINE HCL (PF) 1 % IJ SOLN
10.0000 mL | Freq: Once | INTRAMUSCULAR | Status: AC
  Administered 2016-11-27: 10 mL
  Filled 2016-11-26: qty 10

## 2016-11-26 NOTE — ED Notes (Signed)
Patient has finished his contrast. CT scan is aware.

## 2016-11-26 NOTE — ED Provider Notes (Signed)
[facility] Emergency Department Provider Note  ____________________________________________  Time seen: Approximately 9:17 PM  I have reviewed the triage vital signs and the nursing notes.   HISTORY  Chief Complaint Abscess    HPI Luis [name] is a 31 y.o. male resistance emergency department complaining of a boil to the suprapubic region. Patient states that skin lesion has been present for 8 days. Patient reports that initially area of lesion was painful, he reports that over the last 24 hours the pain has increased to include bilateral lower quadrant abdominal pain. Patient denies any fevers or chills, nausea or vomiting, diarrhea or constipation. He denies any drainage from the lesion. He denies any dysuria, polyuria, hematuria. No history of recurring skin lesions. No other complaints at this time.   Past Medical History:  Diagnosis Date  . SVT (supraventricular tachycardia) [facility])     Patient Active Problem List   Diagnosis Date Noted  . SVT (supraventricular tachycardia) (HCC) 01/19/2011  . TACHYCARDIA 06/12/2010  . SYNCOPE 04/10/2010  . PALPITATIONS, OCCASIONAL 02/15/2010    Past Surgical History:  Procedure Laterality Date  . ROOT CANAL  2005    Prior to Admission medications   Medication Sig Start Date End Date Taking? Authorizing Provider  clindamycin (CLEOCIN) 300 MG capsule Take 1 capsule (300 mg total) by mouth 4 (four) times daily. 11/27/16   Delorise Royals Cuthriell, PA-C  metoprolol tartrate (LOPRESSOR) 25 MG tablet Take 25 mg by mouth 2 (two) times daily as needed.      Historical Provider, MD  oxyCODONE-acetaminophen (ROXICET) 5-325 MG tablet Take 1 tablet by mouth every 6 (six) hours as needed for severe pain. 11/27/16   Delorise Royals Cuthriell, PA-C    Allergies Patient has no known allergies.  Family History  Problem Relation Age of Onset  . Hypertension Father   . Heart disease Maternal Grandfather     Social History Social  History  Substance Use Topics  . Smoking status: Current Every Day Smoker    Packs/day: 1.00    Years: 6.00    Types: Cigarettes  . Smokeless tobacco: Never Used  . Alcohol use 0.6 oz/week    1 Cans of beer per week     Comment: drinks on weekends     Review of Systems  Constitutional: No fever/chills Cardiovascular: no chest pain. Respiratory: no cough. No SOB. Gastrointestinal: Positive bilateral lower quadrant abdominal pain.  No nausea, no vomiting.  No diarrhea.  No constipation. Genitourinary: Negative for dysuria. No hematuria Musculoskeletal: Negative for musculoskeletal pain. Skin: Positive for dizziness, erythematous skin lesion to the suprapubic region  Neurological: Negative for headaches, focal weakness or numbness. 10-point ROS otherwise negative.  ____________________________________________   PHYSICAL EXAM:  VITAL SIGNS: ED Triage Vitals  Enc Vitals Group     BP 11/26/16 2004 (!) 143/82     Pulse Rate 11/26/16 2004 77     Resp 11/26/16 2004 16     Temp 11/26/16 2004 98.4 F (36.9 C)     Temp Source 11/26/16 2004 Oral     SpO2 11/26/16 2004 100 %     Weight 11/26/16 2004 145 lb (65.8 kg)     Height 11/26/16 2004 5\' 10"  (1.778 m)     Head Circumference --      Peak Flow --      Pain Score 11/26/16 2023 5     Pain Loc --      Pain Edu? --      Excl. in GC? --  Constitutional: Alert and oriented. Well appearing and in no acute distress. Eyes: Conjunctivae are normal. PERRL. EOMI. Head: Atraumatic. Neck: No stridor.    Cardiovascular: Normal rate, regular rhythm. Normal S1 and S2.  Good peripheral circulation. Respiratory: Normal respiratory effort without tachypnea or retractions. Lungs CTAB. Good air entry to the bases with no decreased or absent breath sounds. Gastrointestinal: Bowel sounds 4 quadrants. Soft to palpation over quadrants. Patient is tender bilateral lower quadrants, more so in the suprapubic region. This is underlying skin  lesion.. No guarding or rigidity. No palpable masses. No distention. No CVA tenderness. Musculoskeletal: Full range of motion to all extremities. No gross deformities appreciated. Neurologic:  Normal speech and language. No gross focal neurologic deficits are appreciated.  Skin:  Skin is warm, dry and intact. No rash noted. Erythematous, edematous skin lesion noted to the suprapubic region. This is midline. Area has eschar-like Center with erythema and edema surrounding. No drainage noted. Area is indurated but no fluctuance. Palpation induces pain to the skin and lower abdominal quadrants. Psychiatric: Mood and affect are normal. Speech and behavior are normal. Patient exhibits appropriate insight and judgement.   ____________________________________________   LABS (all labs ordered are listed, but only abnormal results are displayed)  Labs Reviewed  COMPREHENSIVE METABOLIC PANEL - Abnormal; Notable for the following:       Result Value   Potassium 3.3 (*)    Calcium 8.8 (*)    ALT 12 (*)    All other components within normal limits  CBC WITH DIFFERENTIAL/PLATELET - Abnormal; Notable for the following:    WBC 14.3 (*)    Neutro Abs 10.3 (*)    Monocytes Absolute 1.2 (*)    All other components within normal limits   ____________________________________________  EKG   ____________________________________________  RADIOLOGY Festus BarrenI, Jonathan D Cuthriell, personally viewed and evaluated these images as part of my medical decision making, as well as reviewing the written report by the radiologist.  Ct Pelvis W Contrast  Result Date: 11/26/2016 CLINICAL DATA:  Increasing inflammation and pain in the subcutaneous tissues of the infraumbilical anterior abdominal wall. EXAM: CT PELVIS WITH CONTRAST TECHNIQUE: Multidetector CT imaging of the pelvis was performed using the standard protocol following the bolus administration of intravenous contrast. CONTRAST:  100mL ISOVUE-300 IOPAMIDOL  (ISOVUE-300) INJECTION 61% COMPARISON:  None. FINDINGS: There is a subcutaneous 1.4 x 1.8 x 1.9 cm collection just to the left of midline, 12 cm below the umbilicus. Mild adjacent subcutaneous edema. The collection is very close to the skin surface and could be a dermal lesion appear no intramuscular collection or other focal abnormality of the abdominal wall musculature. No abnormalities deep to the abdominal wall musculature. Bowel:  Visible bowel is unremarkable. Vascular/Lymphatic: No significant vascular abnormality. Reproductive:  Unremarkable. Other:  No ascites or other lower abdomen/pelvic collection. Musculoskeletal: No significant bony abnormality. IMPRESSION: Subcutaneous collection measuring up to 1.9 cm corresponding to the area of clinical concern. This could represent a very superficial abscess. No abnormalities deep to the abdominal wall. Electronically Signed   By: Ellery Plunkaniel R Mitchell M.D.   On: 11/26/2016 22:46    ____________________________________________    PROCEDURES  Procedure(s) performed:    Marland Kitchen.Marland Kitchen.Incision and Drainage Date/Time: 11/27/2016 12:26 AM Performed by: Gala RomneyUTHRIELL, JONATHAN D Authorized by: Gala RomneyUTHRIELL, JONATHAN D   Consent:    Consent obtained:  Verbal   Consent given by:  Patient   Risks discussed:  Incomplete drainage and pain Location:    Type:  Abscess   Size:  2 cm   Location:  Trunk   Trunk location:  Abdomen Pre-procedure details:    Skin preparation:  Betadine Anesthesia (see MAR for exact dosages):    Anesthesia method:  Local infiltration   Local anesthetic:  Lidocaine 1% w/o epi Procedure type:    Complexity:  Complex Procedure details:    Needle aspiration: no     Incision types:  Single straight   Incision depth:  Subcutaneous   Scalpel blade:  11   Wound management:  Probed and deloculated, extensive cleaning, debrided and irrigated with saline   Wound treatment:  Wound left open   Packing materials:  None Post-procedure details:     Patient tolerance of procedure:  Tolerated well, no immediate complications Comments:     On initial incision, no drainage was appreciated. Localized abscess on CT was in fact necrotic tissue. No purulent drainage. Significant necrotic subcutaneous tissue was visualized. This was debrided using 11 blade scalpel, forceps. All visualized necrotic tissue is removed and clean margins are visualized around the entire wound. Due to the nature of wound, this was unable to be packed.. Debridement is shallow in nature but stretches approximately 2 cm in diameter. Wound was covered with nonadhesive, petroleum soaked gauze and covered using Tegaderm. Patient tolerated procedure well. No complications.      Medications  lidocaine (PF) (XYLOCAINE) 1 % injection 10 mL (not administered)  sodium chloride 0.9 % bolus 1,000 mL (0 mLs Intravenous Stopped 11/26/16 2323)  iopamidol (ISOVUE-300) 61 % injection 30 mL (30 mLs Oral Contrast Given 11/26/16 2142)  iopamidol (ISOVUE-300) 61 % injection 100 mL (100 mLs Intravenous Contrast Given 11/26/16 2234)  clindamycin (CLEOCIN) IVPB 600 mg (0 mg Intravenous Stopped 11/26/16 2323)     ____________________________________________   INITIAL IMPRESSION / ASSESSMENT AND PLAN / ED COURSE  Pertinent labs & imaging results that were available during my care of the patient were reviewed by me and considered in my medical decision making (see chart for details).  Review of the  CSRS was performed in accordance of the [facility] prior to dispensing any controlled drugs.     Patient's diagnosis is consistent with abdominal wall abscess. On incision and drainage, localized abscess on CT was in fact necrotic tissue. This was debrided as described above. Patient handled procedure well. No complications. Wound was dressed. While emergency department, patient was given IV antibiotics. He will be discharged home with clindamycin and pain medication. Patient will follow up with myself  in 2 days for wound recheck..  Patient is given ED precautions to return to the ED sooner for any worsening or new symptoms.     ____________________________________________  FINAL CLINICAL IMPRESSION(S) / ED DIAGNOSES  Final diagnoses:  Abscess of abdominal wall      NEW MEDICATIONS STARTED DURING THIS VISIT:  New Prescriptions   CLINDAMYCIN (CLEOCIN) 300 MG CAPSULE    Take 1 capsule (300 mg total) by mouth 4 (four) times daily.   OXYCODONE-ACETAMINOPHEN (ROXICET) 5-325 MG TABLET    Take 1 tablet by mouth every 6 (six) hours as needed for severe pain.        This chart was dictated using voice recognition software/Dragon. Despite best efforts to proofread, errors can occur which can change the meaning. Any change was purely unintentional.    Racheal Patches, PA-C 11/27/16 0031    Sharyn Creamer, MD 11/30/16 [phone]

## 2016-11-26 NOTE — ED Notes (Signed)
Patient taken to CT scan.

## 2016-11-26 NOTE — ED Triage Notes (Signed)
Pt ambulatory to triage with steady gait, no distress noted. Pt c/o raised reddened area to the groin x8 days. On assessment pt has raised, red area with black center. Pt denies any drainage.

## 2016-11-27 MED ORDER — CLINDAMYCIN HCL 300 MG PO CAPS: 300.0000 mg | ORAL_CAPSULE | Freq: Four times a day (QID) | ORAL | 0 refills | Status: DC

## 2016-11-27 MED ORDER — OXYCODONE-ACETAMINOPHEN 5-325 MG PO TABS: 1.0000 | ORAL_TABLET | Freq: Four times a day (QID) | ORAL | 0 refills | Status: DC | PRN

## 2016-11-27 NOTE — ED Notes (Signed)

## 2016-11-29 ENCOUNTER — Emergency Department
Admission: EM | Admit: 2016-11-29 | Discharge: 2016-11-29 | Disposition: A | Discharge status: Home / Self Care | Payer: Commercial Managed Care - HMO | Attending: Emergency Medicine | Admitting: Emergency Medicine

## 2016-11-29 ENCOUNTER — Encounter: Payer: Self-pay | Admitting: Emergency Medicine

## 2016-11-29 DIAGNOSIS — Z4801 Encounter for change or removal of surgical wound dressing: Secondary | ICD-10-CM | POA: Insufficient documentation

## 2016-11-29 DIAGNOSIS — F17210 Nicotine dependence, cigarettes, uncomplicated: Secondary | ICD-10-CM | POA: Insufficient documentation

## 2016-11-29 DIAGNOSIS — Z5189 Encounter for other specified aftercare: Secondary | ICD-10-CM

## 2016-11-29 NOTE — ED Provider Notes (Signed)
[facility] Emergency Department Provider Note  ____________________________________________  Time seen: Approximately 9:01 PM  I have reviewed the triage vital signs and the nursing notes.   HISTORY  Chief Complaint Wound Check    HPI Luis [name] is a 31 y.o. male who presents emergency department for wound recheck. Patient was seen by myself, see note from 2 days prior, for abscess to the abdominal wall. Patient has been taking his antibiotics, he reports that area has continued to drain peas been keeping area clean and covered. Patient reports that pain has significantly improved since initial visit. He denies any fevers or chills, abdominal pain, nausea or vomiting. The patient is here for wound recheck.   Past Medical History:  Diagnosis Date  . SVT (supraventricular tachycardia) [facility])     Patient Active Problem List   Diagnosis Date Noted  . SVT (supraventricular tachycardia) (HCC) 01/19/2011  . TACHYCARDIA 06/12/2010  . SYNCOPE 04/10/2010  . PALPITATIONS, OCCASIONAL 02/15/2010    Past Surgical History:  Procedure Laterality Date  . ROOT CANAL  2005    Prior to Admission medications   Medication Sig Start Date End Date Taking? Authorizing Provider  clindamycin (CLEOCIN) 300 MG capsule Take 1 capsule (300 mg total) by mouth 4 (four) times daily. 11/27/16   Delorise Royals Cuthriell, PA-C  metoprolol tartrate (LOPRESSOR) 25 MG tablet Take 25 mg by mouth 2 (two) times daily as needed.      Historical Provider, MD  oxyCODONE-acetaminophen (ROXICET) 5-325 MG tablet Take 1 tablet by mouth every 6 (six) hours as needed for severe pain. 11/27/16   Delorise Royals Cuthriell, PA-C    Allergies Patient has no known allergies.  Family History  Problem Relation Age of Onset  . Hypertension Father   . Heart disease Maternal Grandfather     Social History Social History  Substance Use Topics  . Smoking status: Current Every Day Smoker    Packs/day: 1.00     Years: 6.00    Types: Cigarettes  . Smokeless tobacco: Never Used  . Alcohol use 0.6 oz/week    1 Cans of beer per week     Comment: drinks on weekends     Review of Systems  Constitutional: No fever/chills Cardiovascular: no chest pain. Respiratory: no cough. No SOB. Gastrointestinal: No abdominal pain.  No nausea, no vomiting.  Musculoskeletal: Negative for musculoskeletal pain. Skin: Positive for incised and drained abscess to the lower abdominal wall. Neurological: Negative for headaches, focal weakness or numbness. 10-point ROS otherwise negative.  ____________________________________________   PHYSICAL EXAM:  VITAL SIGNS: ED Triage Vitals [11/29/16 2056]  Enc Vitals Group     BP      Pulse      Resp      Temp      Temp src      SpO2      Weight 143 lb (64.9 kg)     Height 5\' 10"  (1.778 m)     Head Circumference      Peak Flow      Pain Score      Pain Loc      Pain Edu?      Excl. in GC?      Constitutional: Alert and oriented. Well appearing and in no acute distress. Eyes: Conjunctivae are normal. PERRL. EOMI. Head: Atraumatic. Neck: No stridor.    Cardiovascular: Normal rate, regular rhythm. Normal S1 and S2.  Good peripheral circulation. Respiratory: Normal respiratory effort without tachypnea or retractions. Lungs CTAB.  Good air entry to the bases with no decreased or absent breath sounds. Gastrointestinal: Bowel sounds 4 quadrants. Soft and nontender to palpation. No guarding or rigidity. No palpable masses. No distention.  Musculoskeletal: Full range of motion to all extremities. No gross deformities appreciated. Neurologic:  Normal speech and language. No gross focal neurologic deficits are appreciated.  Skin:  Skin is warm, dry and intact. No rash noted.Incised and drained abscess to the lower abdominal wall. This is healing appropriately with reducing signs of infection. Minimal, pustular drainage is appreciated at the surgical opening site.  Underlying tissue is healthy appearing. Patient has drastic signs of improvement from previous visit. Psychiatric: Mood and affect are normal. Speech and behavior are normal. Patient exhibits appropriate insight and judgement.   ____________________________________________   LABS (all labs ordered are listed, but only abnormal results are displayed)  Labs Reviewed - No data to display ____________________________________________  EKG   ____________________________________________  RADIOLOGY   No results found.  ____________________________________________    PROCEDURES  Procedure(s) performed:    Procedures    Medications - No data to display   ____________________________________________   INITIAL IMPRESSION / ASSESSMENT AND PLAN / ED COURSE  Pertinent labs & imaging results that were available during my care of the patient were reviewed by me and considered in my medical decision making (see chart for details).  Review of the [place] CSRS was performed in accordance of the [facility] prior to dispensing any controlled drugs.     Patient's diagnosis is consistent with wound recheck from abscess incision and drainage. See previous note for details on initial presentation and abscess incision and drainage. Patient reported improving symptoms. Exam reveals appropriately healing incision and drainage. Patient has drastic signs of improvement from previous visit. Patient will continue antibiotic use at home. He will follow up with primary care as needed..  Patient is given ED precautions to return to the ED for any worsening or new symptoms.     ____________________________________________  FINAL CLINICAL IMPRESSION(S) / ED DIAGNOSES  Final diagnoses:  Wound check, abscess      NEW MEDICATIONS STARTED DURING THIS VISIT:  New Prescriptions   No medications on file        This chart was dictated using voice recognition software/Dragon. Despite best efforts to  proofread, errors can occur which can change the meaning. Any change was purely unintentional.    Racheal PatchesJonathan D Cuthriell, PA-C 11/29/16 2118    Arnaldo NatalPaul F Malinda, MD 11/29/16 2227

## 2016-11-29 NOTE — ED Triage Notes (Signed)
Pt had an abscess lanced and is back for a wound recheck.. Wound is slightly purulent with no smell noted.  Pt reports no fevers or pain at this time, he just "wants to make sure it it healing ok"

## 2018-01-28 IMAGING — CT CT PELVIS W/ CM
2 of 3 series · 16 of 46 positions shown, 18 images · IV contrast (APPLIED)
Comparison: None.

CLINICAL DATA: Increasing inflammation and pain in the subcutaneous
tissues of the infraumbilical anterior abdominal wall.

EXAM:
CT PELVIS WITH CONTRAST
TECHNIQUE: Multidetector CT imaging of the pelvis was performed using the
standard protocol following the bolus administration of intravenous
contrast.
CONTRAST:  100mL E33A05-AOO IOPAMIDOL (E33A05-AOO) INJECTION 61%

[Series 2: routine abd/pel with · axial · 0.67mm/px · z∈[-331,-111]mm · 13 of 52 slices shown, 15 images]
[im 4/52  soft-tissue]
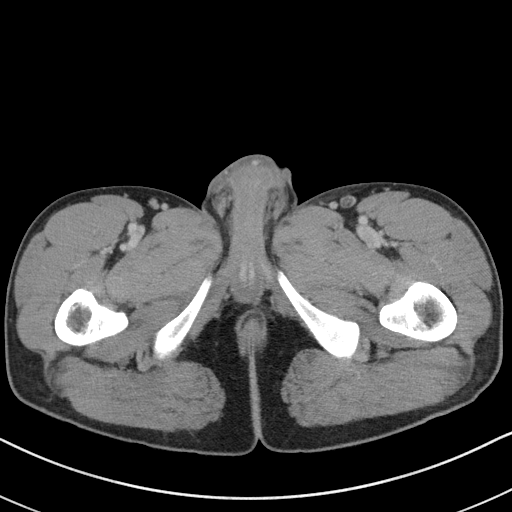
[im 4/52  bone]
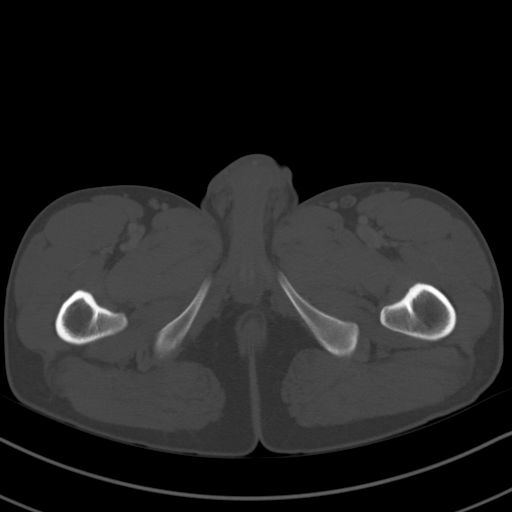
[im 7/52  soft-tissue]
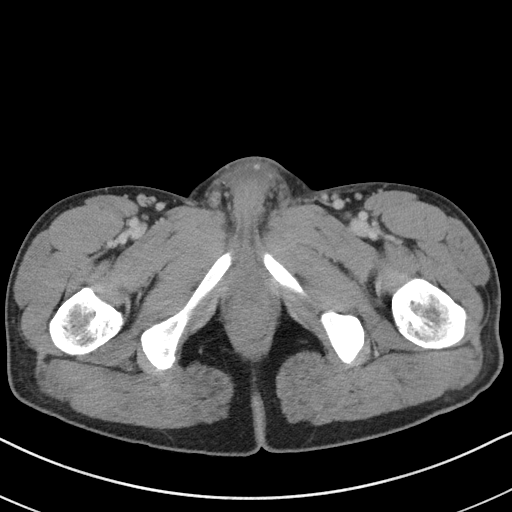
[im 10/52  soft-tissue]
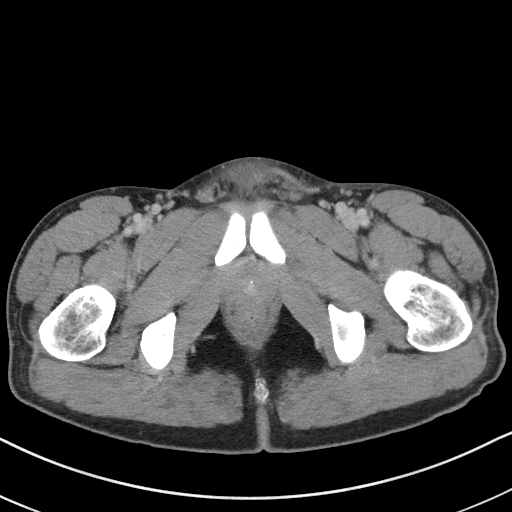
[im 15/52  soft-tissue]
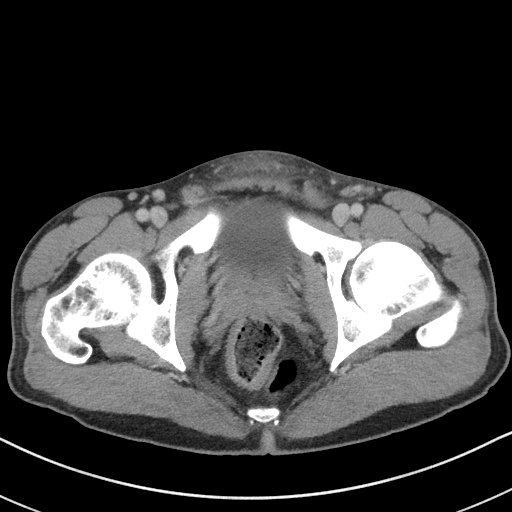
[im 19/52  soft-tissue]
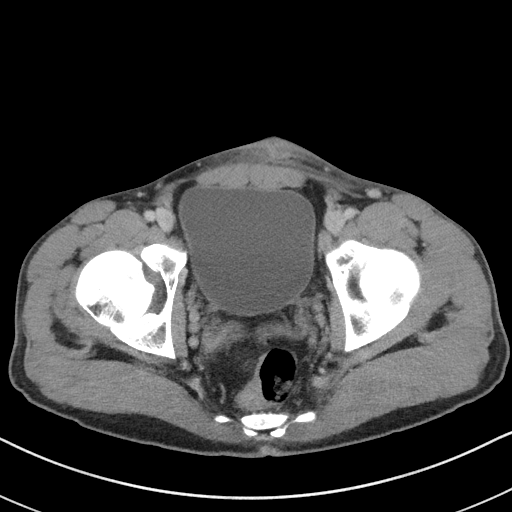
[im 22/52  soft-tissue]
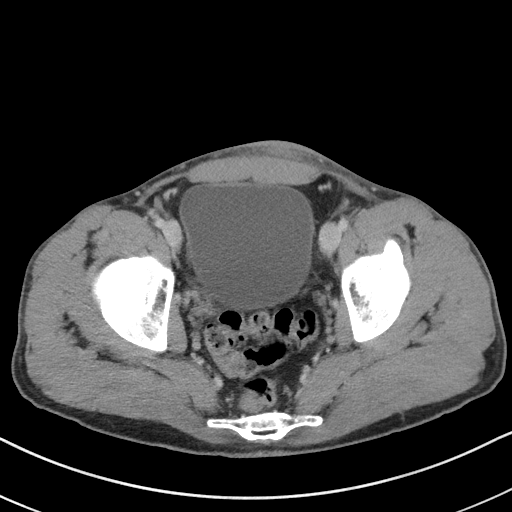
[im 27/52  soft-tissue]
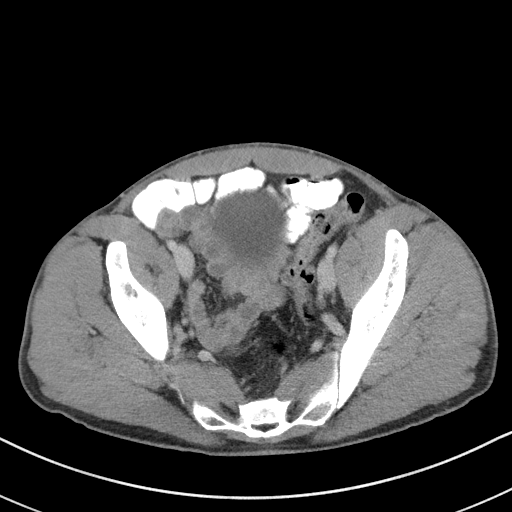
[im 30/52  soft-tissue]
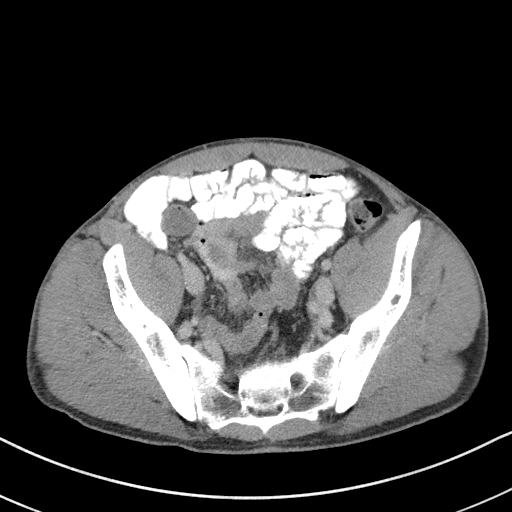
[im 33/52  soft-tissue]
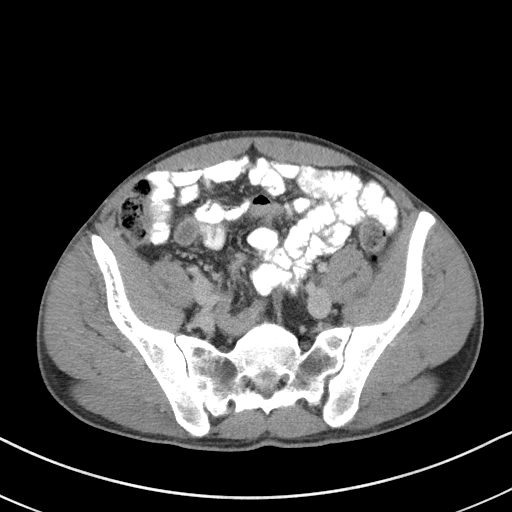
[im 33/52  bone]
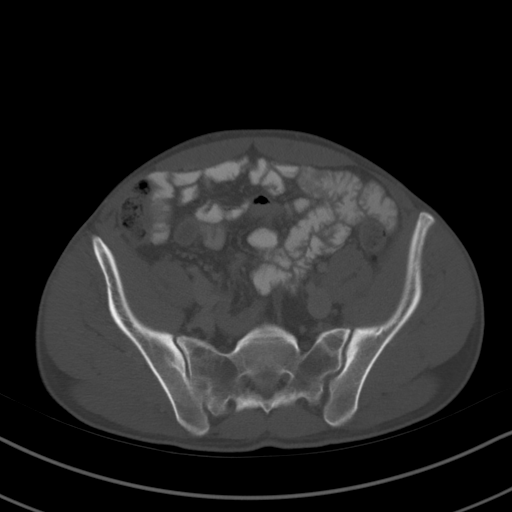
[im 37/52  soft-tissue]
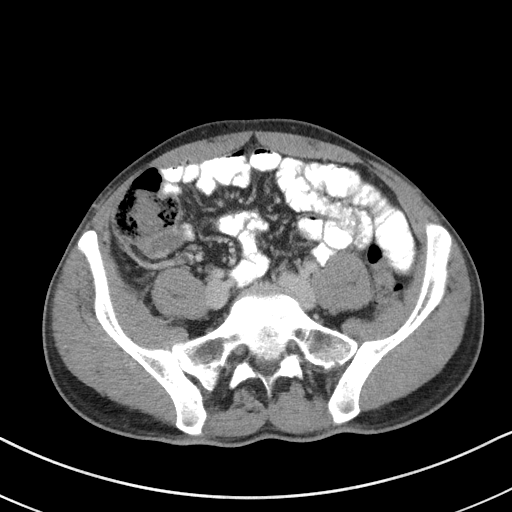
[im 42/52  soft-tissue]
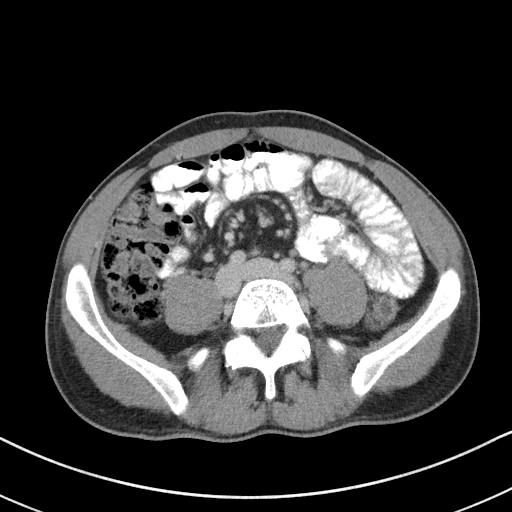
[im 45/52  soft-tissue]
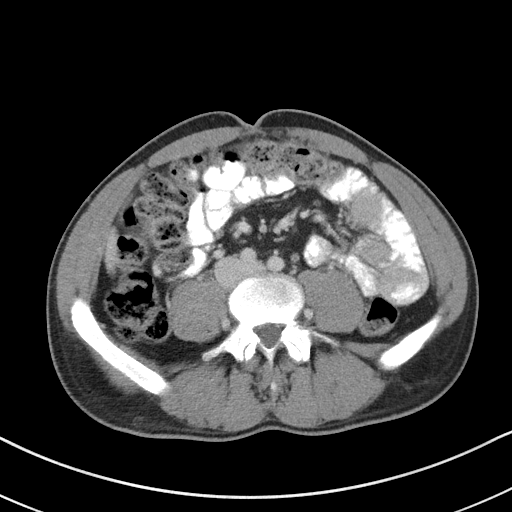
[im 48/52  soft-tissue]
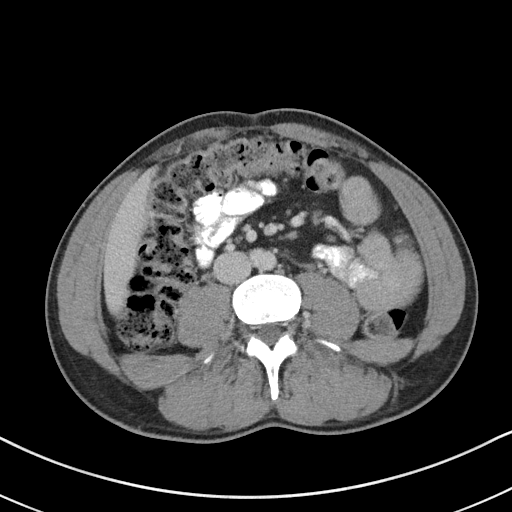

[Series 4: coronal st · coronal · 0.51mm/px · 3 of 77 slices shown]
[im 26/77  soft-tissue]
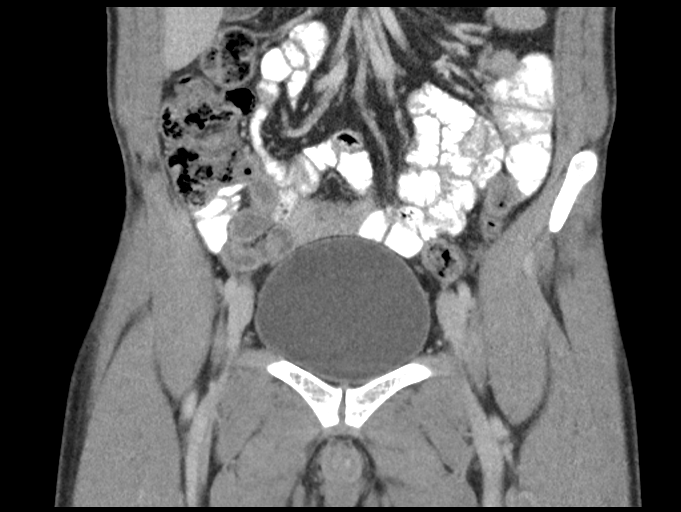
[im 34/77  soft-tissue]
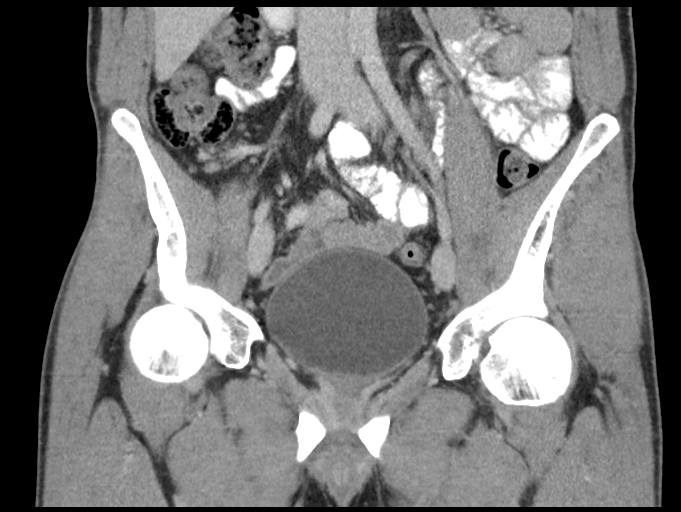
[im 43/77  soft-tissue]
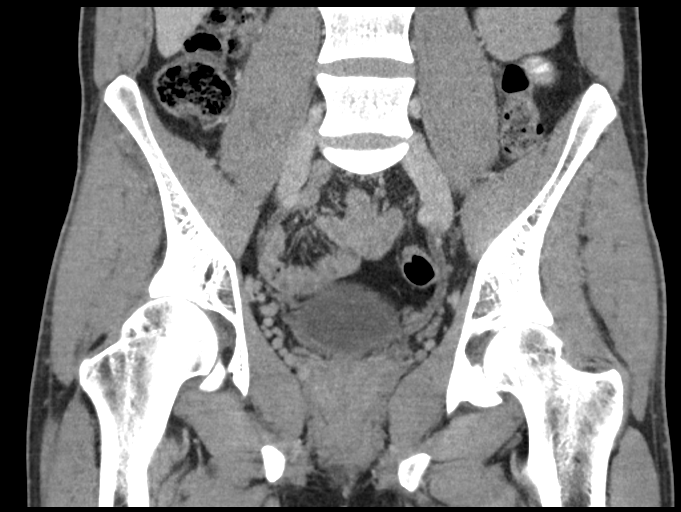

[16 of 46 positions shown; findings below may reference images not displayed]

FINDINGS: There is a subcutaneous 1.4 x 1.8 x 1.9 cm collection just to the
left of midline, 12 cm below the umbilicus. Mild adjacent
subcutaneous edema. The collection is very close to the skin surface
and could be a dermal lesion appear no intramuscular collection or
other focal abnormality of the abdominal wall musculature. No
abnormalities deep to the abdominal wall musculature.

Bowel:  Visible bowel is unremarkable.

Vascular/Lymphatic: No significant vascular abnormality.

Reproductive:  Unremarkable.

Other:  No ascites or other lower abdomen/pelvic collection.

Musculoskeletal: No significant bony abnormality.
IMPRESSION: Subcutaneous collection measuring up to 1.9 cm corresponding to the
area of clinical concern. This could represent a very superficial
abscess. No abnormalities deep to the abdominal wall.

## 2018-09-11 ENCOUNTER — Other Ambulatory Visit
Admission: RE | Admit: 2018-09-11 | Discharge: 2018-09-11 | Disposition: A | Discharge status: Home / Self Care | Payer: 59 | Source: Ambulatory Visit | Attending: Family Medicine | Admitting: Family Medicine

## 2018-09-11 DIAGNOSIS — R079 Chest pain, unspecified: Secondary | ICD-10-CM | POA: Insufficient documentation

## 2018-09-11 LAB — FIBRIN DERIVATIVES D-DIMER (ARMC ONLY): FIBRIN DERIVATIVES D-DIMER (ARMC): 80.12 ng{FEU}/mL (ref 0.00–499.00)

## 2018-12-30 NOTE — Progress Notes (Signed)
Cardiology Office Note  Date:  01/02/2019   ID:  Luis [name], DOB: [birth year], MRN: [id number]  PCP:  [facility]   Chief Complaint  Patient presents with  . other    DOT clearance no complaints today. Meds reviewed verbally with pt.    HPI:  Luis [name] is a 33 y.o. 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 Last seen by this office in 2012. He is doing well today. He has not had a SVT episode since 2012 and never got an ablation.  Previously seen by Dr. Johney Frame.  Did not have insurance at that time.  Reports that after that visit SVT seemed to resolve and is not had any further episodes.   Back in 2012 normal echocardiogram and normal stress test Believes the SVT was caused from stress, possibly even from smoking. He quit smoking 3 years ago.    Had an echo done in 2011 that showed normal EF.  Ejection fraction 55 to 60%  Reports he is currently in a healthy relationship with plans of marriage in the future.   Sometimes has lower back pain due from work and takes ibuprofen to resolve it.    Today's Blood pressure 124/76 Lab work reviewed with him in detail CR 0.8 Glucose 95 CBC normal, LFTs normal  EKG personally reviewed by myself on todays visit Shows normal sinus rhythm. 62 bpm. Consider voltage criteria for left ventricular hypertrophy.  Otherwise normal EKG   OTHER PAST MEDICAL HISTORY REVIEWED BY ME FOR TODAY'S VISIT:   PMH:   has a past medical history of SVT (supraventricular tachycardia) (HCC).  PSH:    Past Surgical History:  Procedure Laterality Date  . ROOT CANAL  2005    Current Outpatient Medications  Medication Sig Dispense Refill  . ibuprofen (ADVIL,MOTRIN) 200 MG tablet  Take 200 mg by mouth every 6 (six) hours as needed.     No current facility-administered medications for this visit.      ALLERGIES:   Penicillins   SOCIAL HISTORY:  The patient  reports that he has quit smoking. His smoking use included cigarettes. He has a 6.00 pack-year smoking history. He has never used smokeless tobacco. He reports current alcohol use of about 1.0 standard drinks of alcohol per week. He reports previous drug use. Frequency: 2.00 times per week. Drug: Marijuana.   FAMILY HISTORY:   family history includes Heart disease in his maternal grandfather; Hypertension in his father.    REVIEW OF SYSTEMS: Review of Systems  Constitutional: Negative.   Eyes: Negative.   Respiratory: Negative.   Cardiovascular: Negative.   Gastrointestinal: Negative.   Genitourinary: Negative.   Musculoskeletal: Negative.   Neurological: Negative.   Psychiatric/Behavioral: Negative.   All other systems reviewed and are negative.    PHYSICAL EXAM: VS:  BP 115/67 (BP Location: Right Arm, Patient Position: Sitting, Cuff Size: Normal)   Pulse 62   Ht 5\' 10"  (1.778 m)   Wt 146 lb 8 oz (66.5 kg)   BMI 21.02 kg/m  , BMI Body mass index is 21.02 kg/m.  GEN: Well nourished, well developed, in no acute distress HEENT: normal Neck: no JVD, carotid bruits, or masses Cardiac: RRR; no murmurs, rubs, or gallops,no edema Respiratory:  clear  to auscultation bilaterally, normal work of breathing GI: soft, nontender, nondistended, + BS MS: no deformity or atrophy Skin: warm and dry, no rash Neuro:  Strength and sensation are intact Psych: euthymic mood, full affect :  RECENT LABS: No results found for requested labs within last 8760 hours.    LIPID PANEL: No results found for: CHOL, HDL, LDLCALC, TRIG    WEIGHT: Wt Readings from Last 3 Encounters:  01/02/19 146 lb 8 oz (66.5 kg)  11/29/16 143 lb (64.9 kg)  11/26/16 145 lb (65.8 kg)       ASSESSMENT AND PLAN:  SVT  (supraventricular tachycardia) (HCC) Plan:  Denies any further episodes since 2012 At that time was evaluated by EP, with consideration of ablation Never had ablation and has not had further episodes in the past 8 years  SYNCOPE Plan:  Previously in the setting of SVT causing hypotension No further episodes of SVT No near syncope or syncope  Smoker Plan: Quit smoking in March 2017  DOT physical Cleared for work, no further testing needed   Disposition:   F/U  PRN  Total encounter time more than 45 minutes. Greater than 50% was spent in counseling and coordination of care with the patient.   Orders Placed This Encounter  Procedures  . EKG 12-Lead    I, Jesus Reyes am acting as a scribe for Julien Nordmann, M.D., Ph.D.  I, Julien Nordmann, M.D. Ph.D., have reviewed the above documentation for accuracy and completeness, and I agree with the above.   Signed, Dossie Arbour, M.D., Ph.D. 01/02/2019  [facility] [place], [place] [phone]

## 2019-01-02 ENCOUNTER — Encounter: Payer: Self-pay | Admitting: Cardiovascular Disease

## 2019-01-02 ENCOUNTER — Encounter: Payer: Self-pay | Admitting: *Deleted

## 2019-01-02 ENCOUNTER — Ambulatory Visit: Payer: Managed Care, Other (non HMO) | Admitting: Cardiovascular Disease

## 2019-01-02 VITALS — BP 115/67 | HR 62 | Ht 70.0 in | Wt 146.5 lb

## 2019-01-02 DIAGNOSIS — F17200 Nicotine dependence, unspecified, uncomplicated: Secondary | ICD-10-CM

## 2019-01-02 DIAGNOSIS — I471 Supraventricular tachycardia: Secondary | ICD-10-CM

## 2019-01-02 DIAGNOSIS — R55 Syncope and collapse: Secondary | ICD-10-CM

## 2019-01-02 NOTE — Patient Instructions (Signed)
Medication Instructions:  None If you need a refill on your cardiac medications before your next appointment, please call your pharmacy.   Lab work: None If you have labs (blood work) drawn today and your tests are completely normal, you will receive your results only by: Marland Kitchen MyChart Message (if you have MyChart) OR . A paper copy in the mail If you have any lab test that is abnormal or we need to change your treatment, we will call you to review the results.  Testing/Procedures: None  Follow-Up: At [facility], you and your health needs are our priority.  As part of our continuing mission to provide you with exceptional heart care, we have created designated Provider Care Teams.  These Care Teams include your primary Cardiologist (physician) and Advanced Practice Providers (APPs -  Physician Assistants and Nurse Practitioners) who all work together to provide you with the care you need, when you need it. You will need a follow up appointment as needed.

## 2019-01-05 ENCOUNTER — Encounter

## 2019-10-13 ENCOUNTER — Ambulatory Visit
Admission: RE | Admit: 2019-10-13 | Discharge: 2019-10-13 | Disposition: A | Discharge status: Home / Self Care | Payer: 59 | Attending: Family Medicine | Admitting: Family Medicine

## 2019-10-13 ENCOUNTER — Ambulatory Visit
Admission: RE | Admit: 2019-10-13 | Discharge: 2019-10-13 | Disposition: A | Discharge status: Home / Self Care | Payer: 59 | Source: Ambulatory Visit | Attending: Family Medicine | Admitting: Family Medicine

## 2019-10-13 ENCOUNTER — Other Ambulatory Visit: Payer: Self-pay | Admitting: Family Medicine

## 2019-10-13 ENCOUNTER — Other Ambulatory Visit: Payer: Self-pay

## 2019-10-13 DIAGNOSIS — Z87891 Personal history of nicotine dependence: Secondary | ICD-10-CM | POA: Insufficient documentation

## 2019-10-13 DIAGNOSIS — R634 Abnormal weight loss: Secondary | ICD-10-CM

## 2020-03-14 ENCOUNTER — Encounter (HOSPITAL_COMMUNITY): Payer: Self-pay

## 2020-03-14 ENCOUNTER — Other Ambulatory Visit: Payer: Self-pay

## 2020-03-14 ENCOUNTER — Emergency Department (HOSPITAL_COMMUNITY)
Admission: EM | Admit: 2020-03-14 | Discharge: 2020-03-14 | Disposition: A | Discharge status: Home / Self Care | Payer: 59 | Attending: Emergency Medicine | Admitting: Emergency Medicine

## 2020-03-14 DIAGNOSIS — Z87891 Personal history of nicotine dependence: Secondary | ICD-10-CM | POA: Insufficient documentation

## 2020-03-14 DIAGNOSIS — M5442 Lumbago with sciatica, left side: Secondary | ICD-10-CM | POA: Diagnosis not present

## 2020-03-14 DIAGNOSIS — M545 Low back pain: Secondary | ICD-10-CM | POA: Diagnosis present

## 2020-03-14 DIAGNOSIS — M5432 Sciatica, left side: Secondary | ICD-10-CM

## 2020-03-14 MED ORDER — NAPROXEN 375 MG PO TABS: 375.0000 mg | ORAL_TABLET | Freq: Two times a day (BID) | ORAL | 0 refills | Status: AC

## 2020-03-14 MED ORDER — KETOROLAC TROMETHAMINE 60 MG/2ML IM SOLN
60.0000 mg | Freq: Once | INTRAMUSCULAR | Status: AC
  Administered 2020-03-14: 60 mg via INTRAMUSCULAR
  Filled 2020-03-14: qty 2

## 2020-03-14 NOTE — Discharge Instructions (Signed)
You are seen today in the emergency room for sciatica.  Please use the 2 attachments that I gave you.  Use the naproxen as prescribed for the next 10 days.  I want you to try and rest and do the exercises that I talked to you about.  Come back to the emergency room if you have any worsening symptoms.  Try and refrain from lifting at work for the next week.  Follow-up with your PCP in the next couple of days.

## 2020-03-14 NOTE — ED Provider Notes (Signed)
[name] EMERGENCY DEPARTMENT Provider Note   CSN: [id number] Arrival date & time: 03/14/20  0945     History Chief Complaint  Patient presents with  . Back Pain  . Leg Pain    Luis [name] is a 34 y.o. male with pertinent past medical history of SVT that presents the emergency department today for lower back pain that radiates down to his leg and foot, states that it has been there for a week.  States that pain starts in his left hip and radiates down to his foot.  States that he also has some associated tingling in the foot, denies any numbness or weakness.  States that the pain occurs during certain movements.  Has been unable to sleep due to the pain.  No fevers, chills, back pain.  Patient denies IV drug use.  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  States that he constantly repeats the same movements for work.  Denies any saddle paresthesias, weakness in the leg, is able to walk here.  Can bear weight on the leg.  Has tried a couple ibuprofens without relief.  Denies any incontinence, dysuria, hematuria.  Patient also denies any chest pain, shortness of breath, dizziness, headache.  Patient states that the only thing that helps his pain is when he moves.  HPI     Past Medical History:  Diagnosis Date  . SVT (supraventricular tachycardia) [facility])     Patient Active Problem List   Diagnosis Date Noted  . SVT (supraventricular tachycardia) ([place]) 01/19/2011  . TACHYCARDIA 06/12/2010  . SYNCOPE 04/10/2010  . PALPITATIONS, OCCASIONAL 02/15/2010    Past Surgical History:  Procedure Laterality Date  . ROOT CANAL  2005       Family History  Problem Relation Age of Onset  . Hypertension Father   . Heart disease Maternal Grandfather     Social History   Tobacco Use  . Smoking status: Former Smoker    Packs/day: 1.00    Years: 6.00    Pack years: 6.00    Types: Cigarettes  . Smokeless tobacco: Never Used  Substance  Use Topics  . Alcohol use: Yes    Alcohol/week: 1.0 standard drinks    Types: 1 Cans of beer per week    Comment: drinks on weekends  . Drug use: Not Currently    Frequency: 2.0 times per week    Types: Marijuana    Home Medications Prior to Admission medications   Medication Sig Start Date End Date Taking? Authorizing Provider  ibuprofen (ADVIL,MOTRIN) 200 MG tablet Take 200 mg by mouth every 6 (six) hours as needed.    [provider]  naproxen (NAPROSYN) 375 MG tablet Take 1 tablet (375 mg total) by mouth 2 (two) times daily. 03/14/20   Alfredia Client, PA-C    Allergies    Penicillins  Review of Systems   Review of Systems  Constitutional: Negative for diaphoresis, fatigue and fever.  Eyes: Negative for visual disturbance.  Respiratory: Negative for shortness of breath.   Cardiovascular: Negative for chest pain.  Gastrointestinal: Negative for abdominal pain, nausea and vomiting.  Endocrine: Negative for polyuria.  Genitourinary: Negative for decreased urine volume, difficulty urinating, dysuria, flank pain, frequency, hematuria and penile pain.  Musculoskeletal: Positive for back pain. Negative for myalgias.  Skin: Negative for color change, pallor, rash and wound.  Neurological: Negative for dizziness, syncope, weakness, light-headedness, numbness and headaches.  Psychiatric/Behavioral: Negative for behavioral problems and confusion.  Physical Exam Updated Vital Signs BP 119/73 (BP Location: Left Arm)   Pulse 78   Temp 98.6 F (37 C) (Oral)   Resp 16   Ht 5\' 10"  (1.778 m)   Wt 61.2 kg   SpO2 99%   BMI 19.37 kg/m   Physical Exam Constitutional:      General: He is not in acute distress.    Appearance: Normal appearance. He is not ill-appearing, toxic-appearing or diaphoretic.  HENT:     Mouth/Throat:     Mouth: Mucous membranes are moist.     Pharynx: Oropharynx is clear.  Eyes:     General: No visual field deficit or scleral icterus.     Extraocular Movements: Extraocular movements intact.     Pupils: Pupils are equal, round, and reactive to light.  Cardiovascular:     Rate and Rhythm: Normal rate and regular rhythm.     Pulses: Normal pulses.     Heart sounds: Normal heart sounds.  Pulmonary:     Effort: Pulmonary effort is normal. No respiratory distress.     Breath sounds: Normal breath sounds. No stridor. No wheezing, rhonchi or rales.  Chest:     Chest wall: No tenderness.  Abdominal:     General: Abdomen is flat. There is no distension.     Palpations: Abdomen is soft.     Tenderness: There is no abdominal tenderness. There is no right CVA tenderness, left CVA tenderness, guarding or rebound.  Musculoskeletal:        General: No swelling or tenderness. Normal range of motion.     Cervical back: Normal range of motion and neck supple. No rigidity.     Right lower leg: No edema.     Left lower leg: No edema.     Comments: Patient with positive leg raise.  Normal sensation throughout leg.  Normal strength in ankle, knee, hip.  No abnormal gait.  No tenderness to back, no midline tenderness, no paraspinal muscle tenderness.  No tenderness to hip or leg.  No overlying erythema or induration on leg.  No edema noticed.  Skin:    General: Skin is warm and dry.     Capillary Refill: Capillary refill takes less than 2 seconds.     Coloration: Skin is not pale.  Neurological:     General: No focal deficit present.     Mental Status: He is alert and oriented to person, place, and time.     Cranial Nerves: Cranial nerves are intact. No cranial nerve deficit.     Sensory: Sensation is intact.     Motor: Motor function is intact. No weakness, tremor or atrophy.     Coordination: Coordination is intact. Romberg sign negative. Coordination normal.     Gait: Gait is intact.  Psychiatric:        Mood and Affect: Mood normal.        Behavior: Behavior normal.     ED Results / Procedures / Treatments   Labs (all labs  ordered are listed, but only abnormal results are displayed) Labs Reviewed - No data to display  EKG None  Radiology No results found.  Procedures Procedures (including critical care time)  Medications Ordered in ED Medications  ketorolac (TORADOL) injection 60 mg (has no administration in time range)    ED Course  I have reviewed the triage vital signs and the nursing notes.  Pertinent labs & imaging results that were available during my care of the patient were reviewed  by me and considered in my medical decision making (see chart for details).    MDM Rules/Calculators/A&P                     Legend Tumminello is a 34 y.o. male with pertinent past medical history of SVT that presents the emergency department today for lower back pain that radiates down to his leg and foot, states that it has been there for a week.   PE was normal neuro exam, PE suggestive of sciatica.  No weakness noted on leg.  Normal sensation.  No signs of infection on leg.  No tenderness to hip or back.  Patient's mechanism is suggestive of sciatica with heavy lifting at work.  Patient to follow-up with PCP.  Will give naproxen for the next 10 days and provide sciatica stretching resources.  Gave strict return precautions of worsening symptoms.  Expressed the importance of resting and doing the sciatica exercises, and lifting as little as he can at work. No need for imaging at this time, normal strength and no midline tenderness.   Patient presents with complaint of back pain.  Patient is nontoxic appearing, vitals are WNL. Patient has normal neurologic exam, no point/focal midline tenderness to palpation. Ambulatory in the ED.  No back pain red flags(Denies numbness, weakness, saddle anesthesia, incontinence to bowel/bladder, fever, chills, IV drug use, dysuria, or hx of cancer. Patient has not had prior back surgeries). No urinary sxs.  Considered UTI/pyelonephritis, kidney stone, aortic aneurysm/dissection, cauda  equina or epidural abscess however these do not feel these diagnoses fit clinical picture at this time. Will treat with Naproxen. I discussed treatment plan, need for PCP follow-up, and return precautions with the patient. Provided opportunity for questions, patient confirmed understanding and is in agreement with plan.   Final Clinical Impression(s) / ED Diagnoses Final diagnoses:  Sciatica of left side    Rx / DC Orders ED Discharge Orders         Ordered    naproxen (NAPROSYN) 375 MG tablet  2 times daily     03/14/20 1226           Farrel Gordon, PA-C 03/15/20 1105    Terrilee Files, MD 03/15/20 1334

## 2020-03-14 NOTE — ED Triage Notes (Signed)
Pt reports 1 week of right sided lower back pain that radiates all the way down his leg to his foot. Pain is worse when he flexes his foot, no swelling or redness noted.

## 2020-12-14 IMAGING — CR DG CHEST 2V
2 series · 2 of 2 positions shown · non-contrast
Comparison: 01/06/2011

CLINICAL DATA: Unintentional weight loss.

EXAM:
CHEST - 2 VIEW

[chest pa]
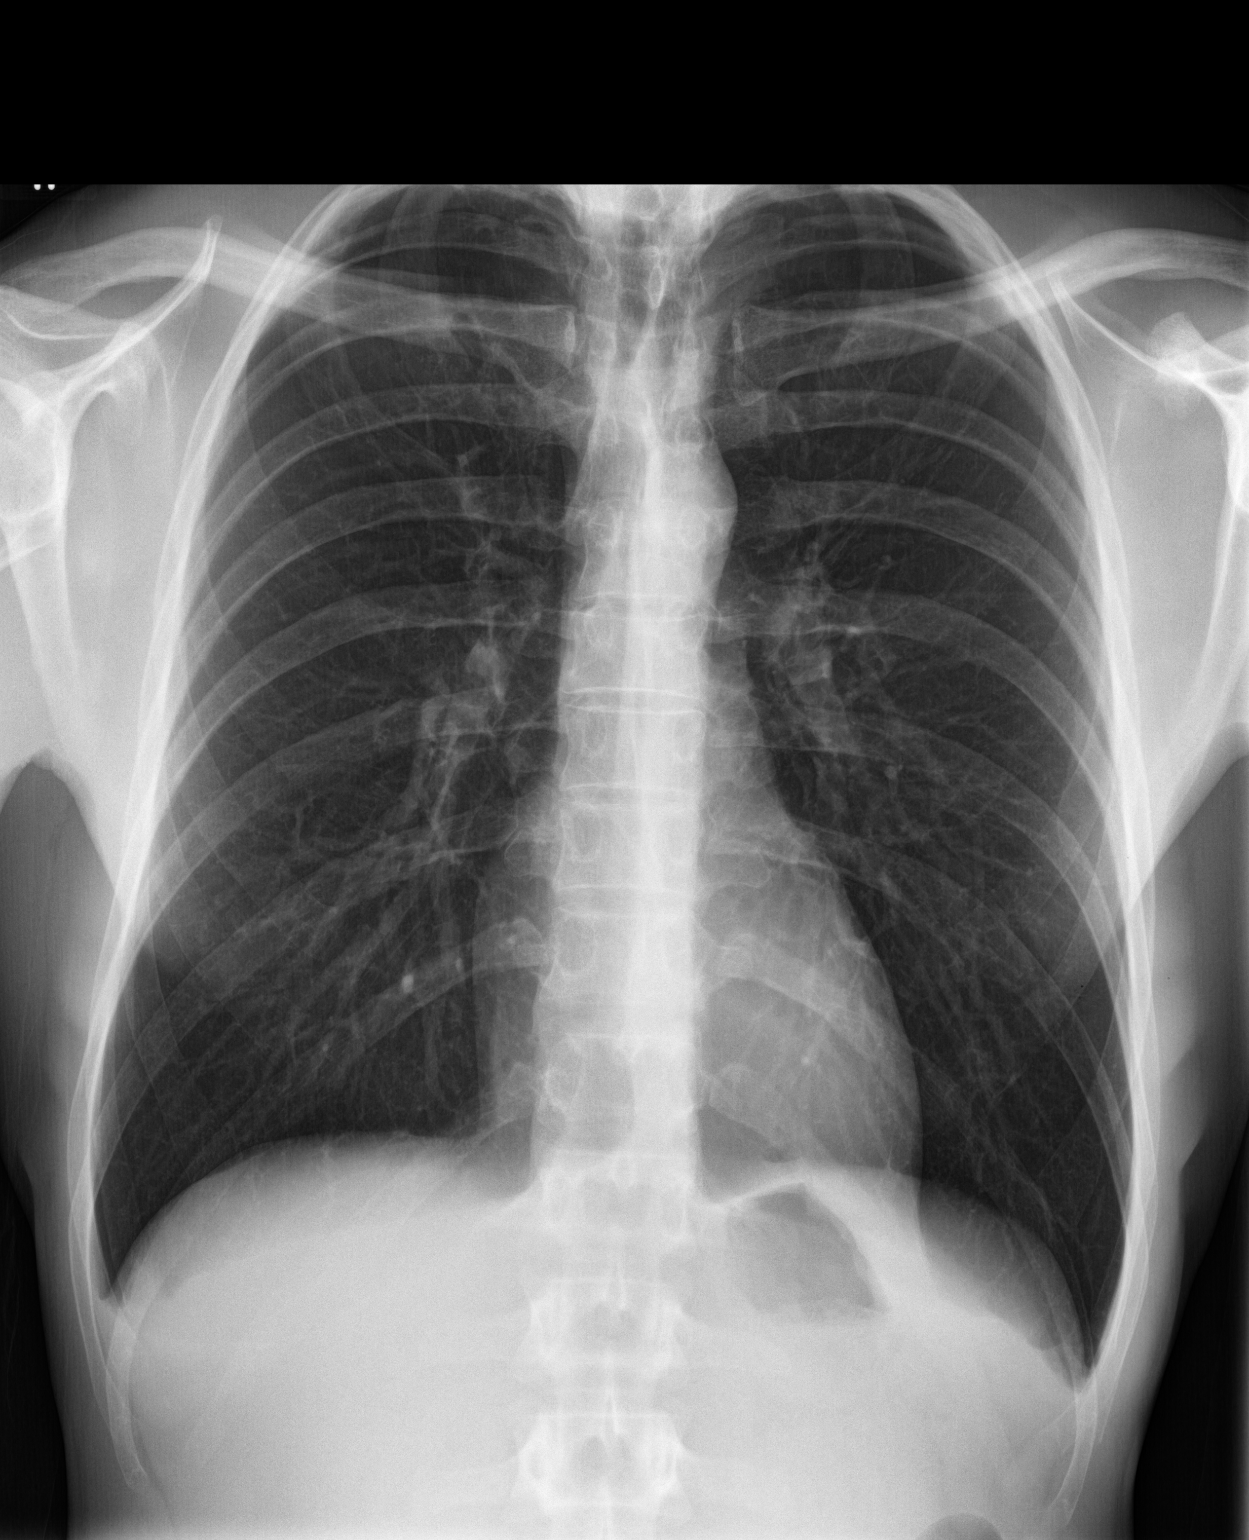

[chest lat]
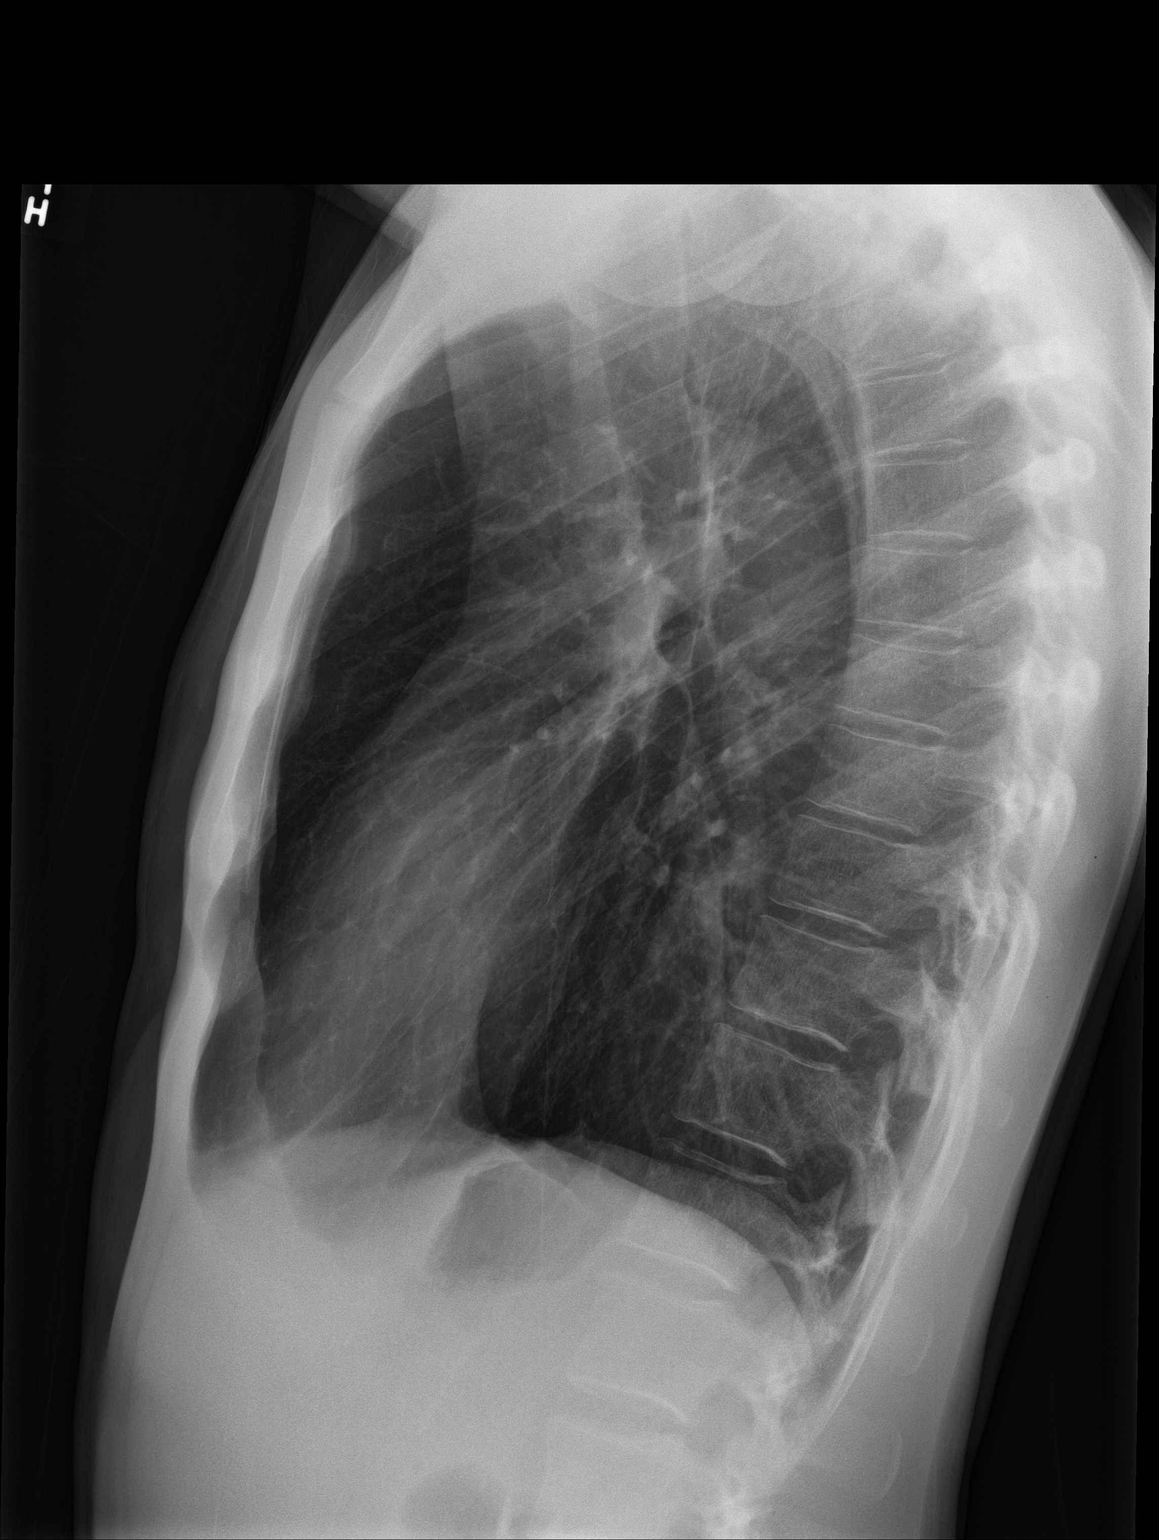

[2 of 2 positions shown; findings below may reference images not displayed]

FINDINGS: Normal heart, mediastinum and hila.

Lungs are hyperexpanded. Minor scarring noted at the apices. Lungs
otherwise clear.

No pleural effusion or pneumothorax.

Skeletal structures are unremarkable.
IMPRESSION: No active cardiopulmonary disease.
